# Patient Record
Sex: Male | Born: 1955
Health system: Southern US, Community
[De-identification: ages and names within clinical notes are randomized; demographics above are authoritative.]

## PROBLEM LIST (undated history)

## (undated) DIAGNOSIS — J383 Other diseases of vocal cords: Secondary | ICD-10-CM

## (undated) DIAGNOSIS — G2581 Restless legs syndrome: Secondary | ICD-10-CM

## (undated) DIAGNOSIS — IMO0001 Reserved for inherently not codable concepts without codable children: Secondary | ICD-10-CM

## (undated) DIAGNOSIS — I10 Essential (primary) hypertension: Secondary | ICD-10-CM

## (undated) DIAGNOSIS — M1990 Unspecified osteoarthritis, unspecified site: Secondary | ICD-10-CM

## (undated) DIAGNOSIS — E669 Obesity, unspecified: Secondary | ICD-10-CM

## (undated) DIAGNOSIS — G259 Extrapyramidal and movement disorder, unspecified: Secondary | ICD-10-CM

## (undated) DIAGNOSIS — G20 Parkinson's disease: Principal | ICD-10-CM

## (undated) DIAGNOSIS — G4730 Sleep apnea, unspecified: Secondary | ICD-10-CM

## (undated) DIAGNOSIS — K219 Gastro-esophageal reflux disease without esophagitis: Secondary | ICD-10-CM

## (undated) HISTORY — DX: Restless legs syndrome: G25.81

## (undated) HISTORY — PX: PAROTID GLAND TUMOR EXCISION: SHX5221

## (undated) HISTORY — DX: Reserved for inherently not codable concepts without codable children: IMO0001

## (undated) HISTORY — DX: Gastro-esophageal reflux disease without esophagitis: K21.9

## (undated) HISTORY — DX: Essential (primary) hypertension: I10

## (undated) HISTORY — DX: Other diseases of vocal cords: J38.3

## (undated) HISTORY — DX: Unspecified osteoarthritis, unspecified site: M19.90

## (undated) HISTORY — PX: CHOLECYSTECTOMY: SHX55

## (undated) HISTORY — DX: Obesity, unspecified: E66.9

## (undated) HISTORY — DX: Extrapyramidal and movement disorder, unspecified: G25.9

## (undated) HISTORY — DX: Parkinson's disease: G20

## (undated) HISTORY — PX: KNEE SURGERY: SHX244

## (undated) HISTORY — PX: OTHER SURGICAL HISTORY: SHX169

---

## 2010-09-15 ENCOUNTER — Ambulatory Visit (HOSPITAL_COMMUNITY)
Admission: RE | Admit: 2010-09-15 | Discharge: 2010-09-15 | Disposition: A | Discharge status: Home / Self Care | Payer: PRIVATE HEALTH INSURANCE | Source: Ambulatory Visit | Attending: Gastroenterology | Admitting: Gastroenterology

## 2010-09-15 DIAGNOSIS — R490 Dysphonia: Secondary | ICD-10-CM | POA: Insufficient documentation

## 2012-12-19 DIAGNOSIS — G245 Blepharospasm: Secondary | ICD-10-CM | POA: Insufficient documentation

## 2013-08-12 ENCOUNTER — Ambulatory Visit (INDEPENDENT_AMBULATORY_CARE_PROVIDER_SITE_OTHER): Payer: 59 | Admitting: Physician Assistant

## 2013-08-12 VITALS — BP 138/90 | HR 74 | Temp 97.8°F | Resp 16 | Ht 67.0 in | Wt 216.8 lb

## 2013-08-12 DIAGNOSIS — R059 Cough, unspecified: Secondary | ICD-10-CM

## 2013-08-12 DIAGNOSIS — R05 Cough: Secondary | ICD-10-CM

## 2013-08-12 DIAGNOSIS — J069 Acute upper respiratory infection, unspecified: Secondary | ICD-10-CM

## 2013-08-12 LAB — POCT CBC
Granulocyte percent: 62.3 %G (ref 37–80)
HCT, POC: 46.9 % (ref 43.5–53.7)
Hemoglobin: 15.3 g/dL (ref 14.1–18.1)
LYMPH, POC: 1.6 (ref 0.6–3.4)
MCH: 26.9 pg — AB (ref 27–31.2)
MCHC: 32.6 g/dL (ref 31.8–35.4)
MCV: 82.4 fL (ref 80–97)
MID (CBC): 0.3 (ref 0–0.9)
MPV: 10.4 fL (ref 0–99.8)
PLATELET COUNT, POC: 277 10*3/uL (ref 142–424)
POC Granulocyte: 3.3 (ref 2–6.9)
POC LYMPH PERCENT: 31.1 %L (ref 10–50)
POC MID %: 6.6 % (ref 0–12)
RBC: 5.69 M/uL (ref 4.69–6.13)
RDW, POC: 15.2 %
WBC: 5.3 10*3/uL (ref 4.6–10.2)

## 2013-08-12 LAB — POCT INFLUENZA A/B
Influenza A, POC: NEGATIVE
Influenza B, POC: NEGATIVE

## 2013-08-12 MED ORDER — HYDROCOD POLST-CHLORPHEN POLST 10-8 MG/5ML PO LQCR: 5.0000 mL | Freq: Two times a day (BID) | ORAL | Status: DC | PRN

## 2013-08-12 MED ORDER — BENZONATATE 200 MG PO CAPS: 200.0000 mg | ORAL_CAPSULE | Freq: Three times a day (TID) | ORAL | Status: DC | PRN

## 2013-08-12 NOTE — Progress Notes (Signed)
Subjective:    Patient ID: Stephen Best, male    DOB: 11/15/1955, 58 y.o.   MRN: [id number]  HPI Primary Physician:  Duane Lopeoss, Alan, MD  Chief Complaint: Cough x 1 week  HPI: 58 y.o. male with history below presents with a 1 week history of nasal congestion, post nasal drip, ST, cough, wheezing, headache, chills, and fatigue. Afebrile. Cough is not productive. Cough seems to get worse as the day progresses and with rapid movement, but not all the time. No SOB or chest pain. Sinus headache. 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 He has been taking Mucinex Dm and ibuprofen for the headaches. He did not get an influenza vaccine this year.    Past Medical History  Diagnosis Date  . HTN (hypertension)   . Reflux      Home Meds: Prior to Admission medications   Medication Sig Start Date End Date Taking? Authorizing Provider  lisinopril-hydrochlorothiazide (PRINZIDE,ZESTORETIC) 20-12.5 MG per tablet Take 1 tablet by mouth daily.   Yes Historical Provider, MD  omeprazole (PRILOSEC) 40 MG capsule Take 40 mg by mouth daily.   Yes Historical Provider, MD  pramipexole (MIRAPEX) 1.5 MG tablet Take 1.5 mg by mouth 3 (three) times daily.   Yes Historical Provider, MD  Vitamin D, Ergocalciferol, (DRISDOL) 50000 UNITS CAPS capsule Take 50,000 Units by mouth every 7 (seven) days.   Yes Historical Provider, MD    Allergies: No Known Allergies  History   Social History  . Marital Status: Divorced    Spouse Name: N/A    Number of Children: N/A  . Years of Education: N/A   Occupational History  . Not on file.   Social History Main Topics  . Smoking status: Never Smoker   . Smokeless tobacco: Not on file  . Alcohol Use: Yes  . Drug Use: No  . Sexual Activity: Not on file   Other Topics Concern  . Not on file   Social History Narrative  . No narrative on file     Review of Systems  Constitutional: Positive for chills and fatigue. Negative  for fever and appetite change.  HENT: Positive for congestion, postnasal drip, sinus pressure and sore throat.        Nasal congestion.   Respiratory: Positive for cough and wheezing. Negative for shortness of breath.        Cough is not productive. Cough is worse later on during the day and with rapid change of position.   Cardiovascular: Negative for chest pain.  Gastrointestinal: Negative for nausea, vomiting and diarrhea.  Musculoskeletal: Negative for myalgias.  Allergic/Immunologic: Positive for environmental allergies.  Neurological: Positive for headaches.       Sinus headaches.        Objective:   Physical Exam  Physical Exam: Blood pressure 138/90, pulse 74, temperature 97.8 F (36.6 C), temperature source Oral, resp. rate 16, height 5\' 7"  (1.702 m), weight 216 lb 12.8 oz (98.34 kg), SpO2 96.00%., Body mass index is 33.95 kg/(m^2). General: Well developed, well nourished, in no acute distress. Head: Normocephalic, atraumatic, eyes without discharge, sclera non-icteric, nares are congested. Bilateral auditory canals clear, TM's are without perforation, pearly grey and translucent with reflective cone of light bilaterally. No sinus TTP. Oral cavity moist, posterior pharynx without exudate, erythema, peritonsillar abscess, or post nasal drip. Uvula midline.  Neck: Supple. No thyromegaly. Full ROM. No lymphadenopathy. No nuchal rigidity.  Lungs: Clear bilaterally to auscultation without  wheezes, rales, or rhonchi. Breathing is unlabored. Heart: RRR with S1 S2. No murmurs, rubs, or gallops appreciated. Msk:  Strength and tone normal for age. Extremities/Skin: Warm and dry. No clubbing or cyanosis. No edema. No rashes or suspicious lesions. Neuro: Alert and oriented X 3. Moves all extremities spontaneously. Gait is normal. CNII-XII grossly in tact. Psych:  Responds to questions appropriately with a normal affect.   Labs: Results for orders placed in visit on 08/12/13  POCT CBC       Result Value Ref Range   WBC 5.3  4.6 - 10.2 K/uL   Lymph, poc 1.6  0.6 - 3.4   POC LYMPH PERCENT 31.1  10 - 50 %L   MID (cbc) 0.3  0 - 0.9   POC MID % 6.6  0 - 12 %M   POC Granulocyte 3.3  2 - 6.9   Granulocyte percent 62.3  37 - 80 %G   RBC 5.69  4.69 - 6.13 M/uL   Hemoglobin 15.3  14.1 - 18.1 g/dL   HCT, POC 16.1  09.6 - 53.7 %   MCV 82.4  80 - 97 fL   MCH, POC 26.9 (*) 27 - 31.2 pg   MCHC 32.6  31.8 - 35.4 g/dL   RDW, POC 04.5     Platelet Count, POC 277  142 - 424 K/uL   MPV 10.4  0 - 99.8 fL  POCT INFLUENZA A/B      Result Value Ref Range   Influenza A, POC Negative     Influenza B, POC Negative         Assessment & Plan:  58 year old male with URI and cough -Supportive care -Tessalon Perles 200 mg 1 po tid prn cough #60 no RF  -Tussionex 1 tsp po q 12 hours prn cough #90 mL -If cough persists into the next several days can call in Z pack -Advised patient to be mindful that his lisinopril can cause a cough. Should his cough persist he should either follow up with his PCP or [place] for further evaluation of this -Rest/fluids -RTC precautions   Eula Listen, MHS, PA-C [facility] [address] [place], [place] [address] [phone] [facility] 08/12/2013 3:44 PM

## 2013-10-02 ENCOUNTER — Ambulatory Visit (INDEPENDENT_AMBULATORY_CARE_PROVIDER_SITE_OTHER): Payer: 59 | Admitting: Family Medicine

## 2013-10-02 VITALS — BP 128/80 | HR 82 | Temp 98.1°F | Resp 16 | Ht 66.5 in | Wt 215.8 lb

## 2013-10-02 DIAGNOSIS — H109 Unspecified conjunctivitis: Secondary | ICD-10-CM

## 2013-10-02 DIAGNOSIS — J329 Chronic sinusitis, unspecified: Secondary | ICD-10-CM

## 2013-10-02 DIAGNOSIS — R0982 Postnasal drip: Secondary | ICD-10-CM

## 2013-10-02 DIAGNOSIS — R059 Cough, unspecified: Secondary | ICD-10-CM

## 2013-10-02 DIAGNOSIS — R05 Cough: Secondary | ICD-10-CM

## 2013-10-02 DIAGNOSIS — B9789 Other viral agents as the cause of diseases classified elsewhere: Secondary | ICD-10-CM

## 2013-10-02 DIAGNOSIS — J069 Acute upper respiratory infection, unspecified: Secondary | ICD-10-CM

## 2013-10-02 MED ORDER — BENZONATATE 100 MG PO CAPS: 100.0000 mg | ORAL_CAPSULE | Freq: Three times a day (TID) | ORAL | Status: DC | PRN

## 2013-10-02 MED ORDER — OFLOXACIN 0.3 % OP SOLN: 1.0000 [drp] | Freq: Four times a day (QID) | OPHTHALMIC | Status: DC

## 2013-10-02 MED ORDER — HYDROCODONE-HOMATROPINE 5-1.5 MG/5ML PO SYRP: 5.0000 mL | ORAL_SOLUTION | ORAL | Status: DC | PRN

## 2013-10-02 MED ORDER — AMOXICILLIN 875 MG PO TABS: 875.0000 mg | ORAL_TABLET | Freq: Two times a day (BID) | ORAL | Status: DC

## 2013-10-02 NOTE — Patient Instructions (Signed)
Drink plenty of fluids and try and get enough rest  Use the eyedrops primarily in the right eye, but occasional 1 in the left eye would probably be good.  Use the benzonatate pills in the daytime as needed for cough  Use the cough syrup at nighttime, hydrocodone  If you develop a lot of purulent drainage or coughing up a lot of green or yellow mucus go ahead and begin the amoxicillin  Return if worse

## 2013-10-02 NOTE — Progress Notes (Signed)
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 He fell again a fever but it is stiff several times and did not have one. By early in the weekend Friday and Saturday he developed a back cough. He then also developed a severe right conjunctivitis. Eyes red and draining. He has a little irritation on the left eye, but not like the right. He has had nasal pressure congestion postnasal drainage. He coughing but not coughing up anything. Does not smoke  : TMs normal Nose congested Right eye very injected. Fundi benign. Throat clear. Chest clear. Heart regular without murmurs. No nodes.  Assessment: Conjunctivitis Cough Sinusitis with postnasal drainage Viral syndrome  Plan: Cough medications and antibiotic eyedrops. Gave prescription for amoxicillin it is not doing better or starts bring a lot of purulent phlegm up he can go and take.

## 2014-02-28 ENCOUNTER — Encounter: Payer: Self-pay | Admitting: *Deleted

## 2014-02-28 ENCOUNTER — Telehealth: Payer: Self-pay | Admitting: *Deleted

## 2014-02-28 NOTE — Telephone Encounter (Signed)
See today's phone note. This patient is no longer a patient here. I did speak with him and he transferred care to [facility]. He just wanted [place] to have this info.

## 2014-02-28 NOTE — Telephone Encounter (Signed)
Noted  

## 2014-04-04 DIAGNOSIS — J383 Other diseases of vocal cords: Secondary | ICD-10-CM | POA: Insufficient documentation

## 2014-04-05 DIAGNOSIS — H25019 Cortical age-related cataract, unspecified eye: Secondary | ICD-10-CM | POA: Insufficient documentation

## 2014-05-02 ENCOUNTER — Ambulatory Visit (INDEPENDENT_AMBULATORY_CARE_PROVIDER_SITE_OTHER): Payer: 59 | Admitting: Neurology

## 2014-05-02 ENCOUNTER — Encounter: Payer: Self-pay | Admitting: Neurology

## 2014-05-02 VITALS — BP 130/83 | HR 90 | Ht 67.0 in | Wt 220.6 lb

## 2014-05-02 DIAGNOSIS — G20A1 Parkinson's disease without dyskinesia, without mention of fluctuations: Secondary | ICD-10-CM

## 2014-05-02 DIAGNOSIS — G2581 Restless legs syndrome: Secondary | ICD-10-CM

## 2014-05-02 DIAGNOSIS — G20 Parkinson's disease: Secondary | ICD-10-CM

## 2014-05-02 HISTORY — DX: Parkinson's disease without dyskinesia, without mention of fluctuations: G20.A1

## 2014-05-02 HISTORY — DX: Parkinson's disease: G20

## 2014-05-02 HISTORY — DX: Restless legs syndrome: G25.81

## 2014-05-02 NOTE — Patient Instructions (Signed)
Parkinson Disease Parkinson disease is a disorder of the central nervous system, which includes the brain and spinal cord. A person with this disease slowly loses the ability to completely control body movements. Within the brain, there is a group of nerve cells (basal ganglia) that help control movement. The basal ganglia are damaged and do not work properly in a person with Parkinson disease. In addition, the basal ganglia produce and use a brain chemical called dopamine. The dopamine chemical sends messages to other parts of the body to control and coordinate body movements. Dopamine levels are low in a person with Parkinson disease. If the dopamine levels are low, then the body does not receive the correct messages it needs to move normally.  CAUSES  The exact reason why the basal ganglia get damaged is not known. Some medical researchers have thought that infection, genes, environment, and certain medicines may contribute to the cause.  SYMPTOMS   An early symptom of Parkinson disease is often an uncontrolled shaking (tremor) of the hands. The tremor will often disappear when the affected hand is consciously used.  As the disease progresses, walking, talking, getting out of a chair, and new movements become more difficult.  Muscles get stiff and movements become slower.  Balance and coordination become harder.  Depression, trouble swallowing, urinary problems, constipation, and sleep problems can occur.  Later in the disease, memory and thought processes may deteriorate. DIAGNOSIS  There are no specific tests to diagnose Parkinson disease. You may be referred to a neurologist for evaluation. Your caregiver will ask about your medical history, symptoms, and perform a physical exam. Blood tests and imaging tests of your brain may be performed to rule out other diseases. The imaging tests may include an MRI or a CT scan. TREATMENT  The goal of treatment is to relieve symptoms. Medicines may be  prescribed once the symptoms become troublesome. Medicine will not stop the progression of the disease, but medicine can make movement and balance better and help control tremors. Speech and occupational therapy may also be prescribed. Sometimes, surgical treatment of the brain can be done in young people. HOME CARE INSTRUCTIONS  Get regular exercise and rest periods during the day to help prevent exhaustion and depression.  If getting dressed becomes difficult, replace buttons and zippers with Velcro and elastic on your clothing.  Take all medicine as directed by your caregiver.  Install grab bars or railings in your home to prevent falls.  Go to speech or occupational therapy as directed.  Keep all follow-up visits as directed by your caregiver. SEEK MEDICAL CARE IF:  Your symptoms are not controlled with your medicine.  You fall.  You have trouble swallowing or choke on your food. MAKE SURE YOU:  Understand these instructions.  Will watch your condition.  Will get help right away if you are not doing well or get worse. Document Released: 05/22/2000 Document Revised: 09/19/2012 Document Reviewed: 06/24/2011 ExitCare Patient Information 2015 [employer]. This information is not intended to replace advice given to you by your health care provider. Make sure you discuss any questions you have with your health care provider.  

## 2014-05-02 NOTE — Progress Notes (Signed)
Reason for visit: Parkinsonism  Stephen Best is a 58 y.o. male  History of present illness:  Stephen Best is a 58 year old right-handed white male with a history of restless leg syndrome. The patient has been seen through this office in the past, last seen on 01/20/2011. The patient had been on Mirapex for the restless leg syndrome, and he has been doing relatively well with this. The patient indicates that within the last several years he has developed blepharospasm, and he was treated with Botox injections. This seemed to help initially, but the last several treatments have not been quite as effective. Within the last 6 months, he began having some tremors involving the hands, right greater than left. He has had some difficulty manipulating a mouse on the computer, and he has noted some micrographia with handwriting. He believes that he is developing some cognitive issues over time. [facility], and it was felt that he had early Parkinson's disease. The patient was placed on Sinemet taking the 10/100 mg tablets 3 times daily. The patient believes that this has helped his handwriting some. The patient is tolerating the medication fairly well. He denies any gait instability or problems with falling. He continues to have problems with bladder spasm, and he indicates that he has difficulty opening his eyes once they go shut. He denies any family history of heart disease or tremor. He is sent to this office for further evaluation. He has not had a recent MRI of the brain.  Past Medical History  Diagnosis Date  . HTN (hypertension)   . Reflux   . Movement disorder   . Parkinson disease 05/02/2014  . Restless leg syndrome 05/02/2014  . Degenerative arthritis   . Obesity   . Vocal cord granuloma     Past Surgical History  Procedure Laterality Date  . Knee surgery Right     Arthroscopic  . Parotid gland tumor excision      adenoma  . Cholecystectomy       Family History  Problem Relation Age of Onset  . Pancreatic cancer Mother   . Heart failure Father   . Kidney disease Father   . Hypertension Brother   . Parkinsonism Neg Hx     Social history:  reports that he has never smoked. He has never used smokeless tobacco. He reports that he drinks alcohol. He reports that he does not use illicit drugs.  Medications:  Current Outpatient Prescriptions on File Prior to Visit  Medication Sig Dispense Refill  . lisinopril-hydrochlorothiazide (PRINZIDE,ZESTORETIC) 20-12.5 MG per tablet Take 1 tablet by mouth daily.    Marland Kitchen. omeprazole (PRILOSEC) 40 MG capsule Take 40 mg by mouth daily.    . pramipexole (MIRAPEX) 1.5 MG tablet Take 1.5 mg by mouth 3 (three) times daily.    . Vitamin D, Ergocalciferol, (DRISDOL) 50000 UNITS CAPS capsule Take 50,000 Units by mouth every 7 (seven) days.     No current facility-administered medications on file prior to visit.     No Known Allergies  ROS:  Out of a complete 14 system review of symptoms, the patient complains only of the following symptoms, and all other reviewed systems are negative.  Excessive thirst Restless legs, daytime drowsiness Environmental allergies Urinary urgency Joint pain Tremors  Blood pressure 130/83, pulse 90, height 5\' 7"  (1.702 m), weight 220 lb 9.6 oz (100.064 kg).  Physical Exam  General: The patient is alert and cooperative at the time of  the examination. The patient is moderately obese.  Eyes: Pupils are equal, round, and reactive to light. Discs are flat bilaterally.  Neck: The neck is supple, no carotid bruits are noted.  Respiratory: The respiratory examination is clear.  Cardiovascular: The cardiovascular examination reveals a regular rate and rhythm, no obvious murmurs or rubs are noted.  Skin: Extremities are without significant edema.  Neurologic Exam  Mental status: The patient is alert and oriented x 3 at the time of the examination. The patient  has apparent normal recent and remote memory, with an apparently normal attention span and concentration ability.  Cranial nerves: Facial symmetry is present. There is good sensation of the face to pinprick and soft touch bilaterally. The strength of the facial muscles and the muscles to head turning and shoulder shrug are normal bilaterally. Speech is well enunciated, no aphasia or dysarthria is noted. Extraocular movements are full. Visual fields are full. The tongue is midline, and the patient has symmetric elevation of the soft palate. No obvious hearing deficits are noted.  Motor: The motor testing reveals 5 over 5 strength of all 4 extremities. Good symmetric motor tone is noted throughout.  Sensory: Sensory testing is intact to pinprick, soft touch, vibration sensation, and position sense on all 4 extremities. No evidence of extinction is noted.  Coordination: Cerebellar testing reveals good finger-nose-finger and heel-to-shin bilaterally. No evidence of an action tremor or resting tremor is seen.  Gait and station: Gait is normal. The patient is able to rise from a seated position with arms crossed. Once up, the patient has good arm swing bilaterally with ambulation. Tandem gait is normal. Romberg is negative. No drift is seen.  Reflexes: Deep tendon reflexes are symmetric and normal bilaterally. Toes are downgoing bilaterally.    MRI brain 04/02/2010:  Impression: Normal MRI brain (without contrast).   Assessment/Plan:  1. Reported Parkinson's disease  2. Restless leg syndrome  3. Blepharospasm  The patient currently has no signs of parkinsonism. However, he is on medications to include Sinemet and Mirapex which may mask the features of early parkinsonism. The patient will be sent for MRI evaluation of the brain. He will be followed over time. No adjustments to his medications will be made at this time. The patient has undergone formal neuropsychological evaluation through  [facility]. The results of this evaluation is not available to me.  Marlan Palau. Keith Willis MD 05/02/2014 7:09 PM  [facility] [address] [place], [place] [id number]  Phone [phone] Fax [phone]

## 2014-06-20 ENCOUNTER — Ambulatory Visit
Admission: RE | Admit: 2014-06-20 | Discharge: 2014-06-20 | Disposition: A | Discharge status: Home / Self Care | Payer: 59 | Source: Ambulatory Visit | Attending: Neurology | Admitting: Neurology

## 2014-06-20 DIAGNOSIS — G20 Parkinson's disease: Secondary | ICD-10-CM

## 2014-06-20 DIAGNOSIS — G2581 Restless legs syndrome: Secondary | ICD-10-CM

## 2014-06-21 ENCOUNTER — Telehealth: Payer: Self-pay | Admitting: Neurology

## 2014-06-21 NOTE — Telephone Encounter (Signed)
  I called the patient. The MRI of the brain is normal.   MRI brain 06/21/2014:  IMPRESSION:  Normal MRI brain (without).

## 2014-10-03 ENCOUNTER — Ambulatory Visit: Payer: 59 | Admitting: Neurology

## 2014-11-28 ENCOUNTER — Ambulatory Visit (INDEPENDENT_AMBULATORY_CARE_PROVIDER_SITE_OTHER): Payer: 59 | Admitting: Nurse Practitioner

## 2014-11-28 ENCOUNTER — Encounter: Payer: Self-pay | Admitting: Nurse Practitioner

## 2014-11-28 VITALS — BP 127/84 | HR 83 | Ht 67.0 in | Wt 224.0 lb

## 2014-11-28 DIAGNOSIS — G2581 Restless legs syndrome: Secondary | ICD-10-CM

## 2014-11-28 DIAGNOSIS — G20 Parkinson's disease: Secondary | ICD-10-CM | POA: Diagnosis not present

## 2014-11-28 NOTE — Patient Instructions (Signed)
Continue Sinemet and Mirapex at current doses Follow-up in 6-8 months

## 2014-11-28 NOTE — Progress Notes (Signed)
I have read the note, and I agree with the clinical assessment and plan.  Stephen Best,Stephen Best   

## 2014-11-28 NOTE — Progress Notes (Signed)
[facility]  PATIENT: Stephen Best DOB: 06-Apr-1956   REASON FOR VISIT: Follow-up for parkinsonism, restless leg syndrome and blepharospasm HISTORY FROM: Patient    HISTORY OF PRESENT ILLNESS:[name] is a 59 year old right-handed white male with a history of restless leg syndrome. The patient was last seen in this office by Dr. Anne Hahn 05/02/2014 and previously 01/20/2011. The patient had been on Mirapex for the restless leg syndrome, and he has been doing relatively well with this. The patient indicates that within the last several years he has developed blepharospasm, and he was treated with Botox injections. This seemed to help initially, but the last several treatments have not been quite as effective. Within the last 6 months, he began having some tremors involving the hands, right greater than left. He has had some difficulty manipulating a mouse on the computer, and he has noted some micrographia with handwriting. He believes that he is developing some cognitive issues over time. He was seen through [facility] for medical evaluation, and it was felt that he had early Parkinson's disease. The patient was placed on Sinemet taking the 10/100 mg tablets 3 times daily. The patient believes that this has helped his handwriting some. The patient is tolerating the medication fairly well. He denies any gait instability or problems with falling. He continues to have problems with bladder spasm.Denies any family history of Parkinson's disease or tremor. He returns for reevaluation. MRI of the brain in January 2016 was normal.   REVIEW OF SYSTEMS: Full 14 system review of systems performed and notable only for those listed, all others are neg:  Constitutional: neg  Cardiovascular: neg Ear/Nose/Throat: neg  Skin: neg Eyes: neg Respiratory: neg Gastroitestinal: Urinary incontinence Hematology/Lymphatic: neg  Endocrine: neg Musculoskeletal:neg Allergy/Immunology:  neg Neurological: neg Psychiatric: neg Sleep : Restless leg   ALLERGIES: No Known Allergies  HOME MEDICATIONS: Outpatient Prescriptions Prior to Visit  Medication Sig Dispense Refill  . fesoterodine (TOVIAZ) 4 MG TB24 tablet Take 4 mg by mouth daily.    Marland Kitchen lisinopril-hydrochlorothiazide (PRINZIDE,ZESTORETIC) 20-12.5 MG per tablet Take 1 tablet by mouth daily.    Marland Kitchen omeprazole (PRILOSEC) 40 MG capsule Take 40 mg by mouth daily.    . pramipexole (MIRAPEX) 1.5 MG tablet Take 2.5 mg by mouth daily.     . carbidopa-levodopa (SINEMET IR) 10-100 MG per tablet Take 1 tablet by mouth 3 (three) times daily.    . Vitamin D, Ergocalciferol, (DRISDOL) 50000 UNITS CAPS capsule Take 50,000 Units by mouth every 7 (seven) days.     No facility-administered medications prior to visit.    PAST MEDICAL HISTORY: Past Medical History  Diagnosis Date  . HTN (hypertension)   . Reflux   . Movement disorder   . Parkinson disease 05/02/2014  . Restless leg syndrome 05/02/2014  . Degenerative arthritis   . Obesity   . Vocal cord granuloma     PAST SURGICAL HISTORY: Past Surgical History  Procedure Laterality Date  . Knee surgery Right     Arthroscopic  . Parotid gland tumor excision      adenoma  . Cholecystectomy      FAMILY HISTORY: Family History  Problem Relation Age of Onset  . Pancreatic cancer Mother   . Heart failure Father   . Kidney disease Father   . Hypertension Brother   . Parkinsonism Neg Hx     SOCIAL HISTORY: History   Social History  . Marital Status: Divorced    Spouse Name: N/A  . Number  of Children: N/A  . Years of Education: N/A   Occupational History  . Not on file.   Social History Main Topics  . Smoking status: Never Smoker   . Smokeless tobacco: Never Used  . Alcohol Use: Yes  . Drug Use: No  . Sexual Activity: Not on file   Other Topics Concern  . Not on file   Social History Narrative     PHYSICAL EXAM  Filed Vitals:   11/28/14 1351   BP: 127/84  Pulse: 83  Height:  (1.702 m)  Weight: 224 lb (101.606 kg)   Body mass index is 35.08 kg/(m^2). General: The patient is alert and cooperative at the time of the examination. The patient is moderately obese. Neck: The neck is supple, no carotid bruits are noted. Cardiovascular: The cardiovascular examination reveals a regular rate and rhythm, no obvious murmurs or rubs are noted. Skin: Extremities are without significant edema.  Neurologic Exam  Mental status: The patient is alert and oriented x 3 at the time of the examination. The patient has apparent normal recent and remote memory, with an apparently normal attention span and concentration ability.  Cranial nerves: Pupils are equal, round, and reactive to light. Discs are flat bilaterally. Extraocular movements are full. Visual fields are full.Facial symmetry is present. There is good sensation of the face to pinprick and soft touch bilaterally. The strength of the facial muscles and the muscles to head turning and shoulder shrug are normal bilaterally. Speech is well enunciated, no aphasia or dysarthria is noted.  The tongue is midline, and the patient has symmetric elevation of the soft palate. No obvious hearing deficits are noted. Motor: The motor testing reveals 5 over 5 strength of all 4 extremities. Good symmetric motor tone is noted throughout. Sensory: Sensory testing is intact to pinprick, soft touch, vibration sensation, and position sense on all 4 extremities. No evidence of extinction is noted. Coordination: Cerebellar testing reveals good finger-nose-finger and heel-to-shin bilaterally. No evidence of an action tremor or resting tremor is seen. Gait and station: Gait is normal. The patient is able to rise from a seated position with arms crossed. Once up, the patient has good arm swing bilaterally with ambulation. Tandem gait is normal. Romberg is negative. No drift is seen. Reflexes: Deep tendon reflexes are  symmetric and normal bilaterally. Toes are downgoing bilaterally.     DIAGNOSTIC DATA (LABS, IMAGING, TESTING) - ASSESSMENT AND PLAN  59 y.o. year old male  has a past medical history of HTN (hypertension); Reflux; Movement disorder; Parkinson disease (05/02/2014); Restless leg syndrome (05/02/2014); Degenerative arthritis; Obesity; and Vocal cord granuloma. here to follow-up 1. Parkinsonism 2. Restless leg syndrome 3. Blepharospasm  Continue Sinemet and Mirapex at current doses Follow-up in 6-8 months Nilda Riggs, [facility], [facility] APRN  [facility] [address], [address] [place], [place] [address] [phone]

## 2015-03-27 ENCOUNTER — Other Ambulatory Visit: Payer: Self-pay | Admitting: Neurology

## 2015-03-28 NOTE — Telephone Encounter (Signed)
OV notes say patient has been taking Sinemet 10/100 one three times daily, but patient is requesting 25/100mg  three times daily indicating this is his current dose.  It does not appear we have prescribed Sinemet previously.  Okay to send this dose?  Thank you.

## 2015-05-29 ENCOUNTER — Telehealth: Payer: Self-pay | Admitting: Neurology

## 2015-05-29 ENCOUNTER — Telehealth: Payer: Self-pay

## 2015-05-29 MED ORDER — PRAMIPEXOLE DIHYDROCHLORIDE 1.5 MG PO TABS: 1.5000 mg | ORAL_TABLET | Freq: Three times a day (TID) | ORAL | Status: DC

## 2015-05-29 NOTE — Telephone Encounter (Signed)
Noreene LarssonJill called from Dr. Ephriam Knucklesankin's office. She needed clarification on the patient's Mirapex. I advised that according to Dr. Anne HahnWillis' note, the patient should be taking Mirapex 1.5 mg three times daily.

## 2015-05-29 NOTE — Telephone Encounter (Signed)
I called the patient. He is on mirapex 1.5 mg tablets, for PD, he should be taking 1 tablet three times a day. He has been taking all three at night for RLS. He is also on sinemet. I will call in the Rx for him, indicating he should be spreading out the doses during the day.

## 2015-06-16 ENCOUNTER — Other Ambulatory Visit: Payer: Self-pay | Admitting: Neurology

## 2015-06-19 ENCOUNTER — Encounter: Payer: Self-pay | Admitting: Nurse Practitioner

## 2015-06-19 ENCOUNTER — Ambulatory Visit (INDEPENDENT_AMBULATORY_CARE_PROVIDER_SITE_OTHER): Payer: 59 | Admitting: Nurse Practitioner

## 2015-06-19 VITALS — BP 130/88 | HR 84 | Ht 67.0 in | Wt 232.0 lb

## 2015-06-19 DIAGNOSIS — G20 Parkinson's disease: Secondary | ICD-10-CM | POA: Diagnosis not present

## 2015-06-19 DIAGNOSIS — H04129 Dry eye syndrome of unspecified lacrimal gland: Secondary | ICD-10-CM | POA: Insufficient documentation

## 2015-06-19 DIAGNOSIS — G2581 Restless legs syndrome: Secondary | ICD-10-CM | POA: Diagnosis not present

## 2015-06-19 NOTE — Patient Instructions (Addendum)
Continue Mirapex at current dose, does not need refills Continue Carbodopa Levodopa at current dose F/U in 6 months

## 2015-06-19 NOTE — Progress Notes (Signed)
I have read the note, and I agree with the clinical assessment and plan.  Jacklyn Branan KEITH   

## 2015-06-19 NOTE — Progress Notes (Signed)
[facility]  PATIENT: Stephen Best DOB: July 26, 1955   REASON FOR VISIT: Follow-up for Parkinson's disease, restless leg syndrome, blepharospasm HISTORY FROM: Patient    HISTORY OF PRESENT ILLNESS: Stephen Best, 60 year old male returns for follow-up. He has a history of restless leg syndrome at this medication was previously prescribed by Dr. Luciana Axe. He was on Mirapex 1.5 to be taken 3 times a day however he was taking all of his medication at bedtime. This is now changed. In addition he has Parkinson's disease diagnosed at [facility] is currently on carbidopa levodopa 25/100 he takes 2 tablets in the morning and 1 late afternoon. He does this because he forgets to take the medication mid day. No tremors noted. No difficulty with ambulation and falls. He denies any cognitive issues. He continues to get Botox for his blepharospasm at [facility]. He has had some micrographia with his handwriting, he claims this usually occurs in the late afternoon. He returns for reevaluation   HISTORY: Stephen Best is a 60 year old right-handed white male with a history of restless leg syndrome. The patient was last seen in this office by Dr. Anne Hahn 05/02/2014 and previously 01/20/2011. The patient had been on Mirapex for the restless leg syndrome, and he has been doing relatively well with this. The patient indicates that within the last several years he has developed blepharospasm, and he was treated with Botox injections. This seemed to help initially, but the last several treatments have not been quite as effective. Within the last 6 months, he began having some tremors involving the hands, right greater than left. He has had some difficulty manipulating a mouse on the computer, and he has noted some micrographia with handwriting. He believes that he is developing some cognitive issues over time. He was seen through [facility] for medical evaluation, and it was felt that he had early Parkinson's  disease. The patient was placed on Sinemet taking the 10/100 mg tablets 3 times daily. The patient believes that this has helped his handwriting some. The patient is tolerating the medication fairly well. He denies any gait instability or problems with falling. He continues to have problems with bladder spasm.Denies any family history of Parkinson's disease or tremor. He returns for reevaluation. MRI of the brain in January 2016 was normal.   REVIEW OF SYSTEMS: Full 14 system review of systems performed and notable only for those listed, all others are neg:  Constitutional: Fatigue Cardiovascular: neg Ear/Nose/Throat: neg  Skin: neg Eyes: neg Respiratory: neg Gastroitestinal: neg  Hematology/Lymphatic: neg  Endocrine: neg Musculoskeletal:neg Allergy/Immunology: neg Neurological: Tremors Psychiatric: neg Sleep : Restless legs   ALLERGIES: No Known Allergies  HOME MEDICATIONS: Outpatient Prescriptions Prior to Visit  Medication Sig Dispense Refill  . carbidopa-levodopa (SINEMET IR) 25-100 MG tablet TAKE ONE TABLET BY MOUTH THREE TIMES DAILY 270 tablet 0  . cholecalciferol (VITAMIN D) 1000 UNITS tablet Take 2,000 Units by mouth daily.    . fesoterodine (TOVIAZ) 4 MG TB24 tablet Take 4 mg by mouth daily.    Marland Kitchen lisinopril-hydrochlorothiazide (PRINZIDE,ZESTORETIC) 20-12.5 MG per tablet Take 1 tablet by mouth daily.    Marland Kitchen omeprazole (PRILOSEC) 40 MG capsule Take 40 mg by mouth daily.    . pramipexole (MIRAPEX) 1.5 MG tablet Take 1 tablet (1.5 mg total) by mouth 3 (three) times daily. (Patient taking differently: Take 1.5 mg by mouth. Started today 1.5mg  in AM and 3.0mg  in PM) 90 tablet 5  . atovaquone-proguanil (MALARONE) 250-100 MG TABS Reported on 06/19/2015  0  .  ciprofloxacin (CIPRO) 500 MG tablet Take 500 mg by mouth 2 (two) times daily. Reported on 06/19/2015  0  . methylphenidate (RITALIN) 10 MG tablet Reported on 06/19/2015  0   No facility-administered medications prior to visit.     PAST MEDICAL HISTORY: Past Medical History  Diagnosis Date  . HTN (hypertension)   . Reflux   . Movement disorder   . Parkinson disease (HCC) 05/02/2014  . Restless leg syndrome 05/02/2014  . Degenerative arthritis   . Obesity   . Vocal cord granuloma     PAST SURGICAL HISTORY: Past Surgical History  Procedure Laterality Date  . Knee surgery Right     Arthroscopic  . Parotid gland tumor excision      adenoma  . Cholecystectomy      FAMILY HISTORY: Family History  Problem Relation Age of Onset  . Pancreatic cancer Mother   . Heart failure Father   . Kidney disease Father   . Hypertension Brother   . Parkinsonism Neg Hx     SOCIAL HISTORY: Social History   Social History  . Marital Status: Divorced    Spouse Name: N/A  . Number of Children: N/A  . Years of Education: N/A   Occupational History  . Not on file.   Social History Main Topics  . Smoking status: Never Smoker   . Smokeless tobacco: Never Used  . Alcohol Use: Yes  . Drug Use: No  . Sexual Activity: Not on file   Other Topics Concern  . Not on file   Social History Narrative     PHYSICAL EXAM  Filed Vitals:   06/19/15 1305  BP: 130/88  Pulse: 84  Height: 5\' 7"  (1.702 m)  Weight: 232 lb (105.235 kg)   Body mass index is 36.33 kg/(m^2). General: The patient is alert and cooperative at the time of the examination. The patient is moderately obese. Neck: The neck is supple, no carotid bruits are noted. Cardiovascular: The cardiovascular examination reveals a regular rate and rhythm, no obvious murmurs or rubs are noted. Skin: Extremities are without significant edema.  Neurologic Exam  Mental status: The patient is alert and oriented x 3 at the time of the examination. The patient has apparent normal recent and remote memory, with an apparently normal attention span and concentration ability.  Cranial nerves: Pupils are equal, round, and reactive to light.  Extraocular movements are  full. Visual fields are full.Negative Myerson sign Facial symmetry is present. There is good sensation of the face to pinprick and soft touch bilaterally. The strength of the facial muscles and the muscles to head turning and shoulder shrug are normal bilaterally. Speech is well enunciated, no aphasia or dysarthria is noted. The tongue is midline, and the patient has symmetric elevation of the soft palate. No obvious hearing deficits are noted. Motor: The motor testing reveals 5 over 5 strength of all 4 extremities. Good symmetric motor tone is noted throughout. No outstretched tremor or resting tremor is seen Sensory: Sensory testing is intact to pinprick, soft touch, vibration sensation, and position sense on all 4 extremities. No evidence of extinction is noted. Coordination: Cerebellar testing reveals good finger-nose-finger and heel-to-shin bilaterally. No evidence of an action tremor or resting tremor is seen. Gait and station: Gait is normal. The patient is able to rise from a seated position with arms crossed. Once up, the patient has good arm swing bilaterally with ambulation. Tandem gait is normal. Romberg is negative. No drift is seen. Reflexes: Deep  tendon reflexes are symmetric and normal bilaterally. Toes are downgoing bilaterally.  DIAGNOSTIC DATA (LABS, IMAGING, TESTING) -  ASSESSMENT AND PLAN  60 y.o. year old male with medical history of hypertension, movement disorder, Parkinson's disease, restless leg syndrome, degenerative arthritis and blepharospasm here to follow-up.  Continue Mirapex at current dose, does not need refills(patient is taking 2.5 pills daily)of the 1.5 mg dose Continue Carbodopa Levodopa 25/100mg  3 times daily Moderate exercise F/U in 6 months, next with Dr. Woody SellerWillis Nancy Carolyn Martin, [facility]NP, [facility], APRN  [facility] [address], [address] [place], [place] [address] [phone]

## 2015-09-25 ENCOUNTER — Other Ambulatory Visit: Payer: Self-pay | Admitting: Neurology

## 2015-12-09 ENCOUNTER — Other Ambulatory Visit: Payer: Self-pay | Admitting: Neurology

## 2015-12-25 ENCOUNTER — Ambulatory Visit: Payer: 59 | Admitting: Neurology

## 2016-01-09 ENCOUNTER — Encounter: Payer: Self-pay | Admitting: Neurology

## 2016-01-09 ENCOUNTER — Ambulatory Visit (INDEPENDENT_AMBULATORY_CARE_PROVIDER_SITE_OTHER): Payer: 59 | Admitting: Neurology

## 2016-01-09 VITALS — BP 118/84 | HR 88 | Resp 16 | Ht 67.0 in | Wt 230.0 lb

## 2016-01-09 DIAGNOSIS — G20 Parkinson's disease: Secondary | ICD-10-CM

## 2016-01-09 DIAGNOSIS — G2581 Restless legs syndrome: Secondary | ICD-10-CM | POA: Diagnosis not present

## 2016-01-09 MED ORDER — PRAMIPEXOLE DIHYDROCHLORIDE ER 4.5 MG PO TB24: 4.5000 mg | ORAL_TABLET | Freq: Every day | ORAL | 1 refills | Status: DC

## 2016-01-09 NOTE — Progress Notes (Signed)
Reason for visit: Parkinson's disease  Stephen Best is an 60 y.o. male  History of present illness:  Stephen Best is a 59 year old right-handed white male with a history of mild Parkinson's disease, and a history of restless leg syndrome since 1977. The patient has been on low-dose Sinemet, he takes Mirapex 1.5 mg 3 times daily, he had been taking it all at night. Since switching to 3 times daily, and he is having periods of time during the day where he is extremely drowsy, particularly around lunchtime. The patient has difficulty staying awake. He indicates that he does snore at night, but he generally goes to bed late, he does not act out his dreams, he does not have vivid dreams. He has not having problems with balance. Occasionally, he may have a tremor that affects his ability to use the mouse on a computer. The patient may have some decreased amplitude of the voice in the evening hours. He returns for an evaluation.  Past Medical History:  Diagnosis Date  . Degenerative arthritis   . HTN (hypertension)   . Movement disorder   . Obesity   . Parkinson disease (HCC) 05/02/2014  . Reflux   . Restless leg syndrome 05/02/2014  . Vocal cord granuloma     Past Surgical History:  Procedure Laterality Date  . CHOLECYSTECTOMY    . KNEE SURGERY Right    Arthroscopic  . PAROTID GLAND TUMOR EXCISION     adenoma    Family History  Problem Relation Age of Onset  . Pancreatic cancer Mother   . Heart failure Father   . Kidney disease Father   . Hypertension Brother   . Parkinsonism Neg Hx     Social history:  reports that he has never smoked. He has never used smokeless tobacco. He reports that he drinks alcohol. He reports that he does not use drugs.   No Known Allergies  Medications:  Prior to Admission medications   Medication Sig Start Date End Date Taking? Authorizing Provider  carbidopa-levodopa (SINEMET IR) 25-100 MG tablet TAKE ONE TABLET BY MOUTH THREE TIMES DAILY  09/25/15  Yes Stephen Spaniel, MD  cholecalciferol (VITAMIN D) 1000 UNITS tablet Take 2,000 Units by mouth daily.   Yes Historical Provider, MD  fesoterodine (TOVIAZ) 4 MG TB24 tablet Take 4 mg by mouth daily.   Yes Historical Provider, MD  lisinopril-hydrochlorothiazide (PRINZIDE,ZESTORETIC) 20-12.5 MG per tablet Take 1 tablet by mouth daily.   Yes Historical Provider, MD  omeprazole (PRILOSEC) 40 MG capsule Take 40 mg by mouth daily.   Yes Historical Provider, MD  Pramipexole Dihydrochloride (MIRAPEX ER) 4.5 MG TB24 Take 1 tablet (4.5 mg total) by mouth daily after supper. 01/09/16   Stephen Spaniel, MD    ROS:  Out of a complete 14 system review of symptoms, the patient complains only of the following symptoms, and all other reviewed systems are negative.  Fatigue Restless legs, insomnia, daytime sleepiness Environmental allergies Incontinence of the bladder Joint pain Speech difficulty, tremors  Blood pressure 118/84, pulse 88, resp. rate 16, height 5\' 7"  (1.702 m), weight 230 lb (104.3 kg).  Physical Exam  General: The patient is alert and cooperative at the time of the examination. The patient is moderately obese.  Skin: No significant peripheral edema is noted.   Neurologic Exam  Mental status: The patient is alert and oriented x 3 at the time of the examination. The patient has apparent normal recent and remote memory, with an apparently  normal attention span and concentration ability.   Cranial nerves: Facial symmetry is present. Speech is normal, no aphasia or dysarthria is noted. Extraocular movements are full. Visual fields are full.  Motor: The patient has good strength in all 4 extremities.  Sensory examination: Soft touch sensation is symmetric on the face, arms, and legs.  Coordination: The patient has good finger-nose-finger and heel-to-shin bilaterally. No evidence of a resting tremor seen.  Gait and station: The patient has a normal gait. Tandem gait is  normal. Romberg is negative. No drift is seen. The patient has arm swing with walking.  Reflexes: Deep tendon reflexes are symmetric.   Assessment/Plan:  1. Parkinson's disease  2. Restless leg syndrome  The patient has no real features of Parkinson's disease on clinical examination today. The patient will continue the low-dose Sinemet, we will switch the Mirapex to an extended release preparation taking 4.5 mg in the evening. The excessive daytime drowsiness may be related to the Mirapex dosing. In the future, we may consider a sleep evaluation if the drowsiness continues. He will follow-up otherwise in 6 months. If the extended release Mirapex is helpful, he is to contact our office for a 90 day supply.  Marlan Palau MD 01/09/2016 8:14 AM  [facility] [address] [place], [place] [address]  Phone [phone] Fax [phone]

## 2016-01-09 NOTE — Patient Instructions (Addendum)
Restless Legs Syndrome Restless legs syndrome is a condition that causes uncomfortable feelings or sensations in the legs, especially while sitting or lying down. The sensations usually cause an overwhelming urge to move the legs. The arms can also sometimes be affected. The condition can range from mild to severe. The symptoms often interfere with a person's ability to sleep. CAUSES The cause of this condition is not known. RISK FACTORS This condition is more likely to develop in:  People who are older than age 50.  Pregnant women. In general, restless legs syndrome is more common in women than in men.  People who have a family history of the condition.  People who have certain medical conditions, such as iron deficiency, kidney disease, Parkinson disease, or nerve damage.  People who take certain medicines, such as medicines for high blood pressure, nausea, colds, allergies, depression, and some heart conditions. SYMPTOMS The main symptom of this condition is uncomfortable sensations in the legs. These sensations may be:  Described as pulling, tingling, prickling, throbbing, crawling, or burning.  Worse while you are sitting or lying down.  Worse during periods of rest or inactivity.  Worse at night, often interfering with your sleep.  Accompanied by a very strong urge to move your legs.  Temporarily relieved by movement of your legs. The sensations usually affect both sides of the body. The arms can also be affected, but this is rare. People who have this condition often have tiredness during the day because of their lack of sleep at night. DIAGNOSIS This condition may be diagnosed based on your description of the symptoms. You may also have tests, including blood tests, to check for other conditions that may lead to your symptoms. In some cases, you may be asked to spend some time in a sleep lab so your sleeping can be monitored. TREATMENT Treatment for this condition is  focused on managing the symptoms. Treatment may include:  Self-help and lifestyle changes.  Medicines. HOME CARE INSTRUCTIONS  Take medicines only as directed by your health care provider.  Try these methods to get temporary relief from the uncomfortable sensations:  Massage your legs.  Walk or stretch.  Take a cold or hot bath.  Practice good sleep habits. For example, go to bed and get up at the same time every day.  Exercise regularly.  Practice ways of relaxing, such as yoga or meditation.  Avoid caffeine and alcohol.  Do not use any tobacco products, including cigarettes, chewing tobacco, or electronic cigarettes. If you need help quitting, ask your health care provider.  Keep all follow-up visits as directed by your health care provider. This is important. SEEK MEDICAL CARE IF: Your symptoms do not improve with treatment, or they get worse.   This information is not intended to replace advice given to you by your health care provider. Make sure you discuss any questions you have with your health care provider.   Document Released: 05/15/2002 Document Revised: 10/09/2014 Document Reviewed: 05/21/2014 Elsevier Interactive Patient Education 2016 [employer]  

## 2016-01-13 ENCOUNTER — Other Ambulatory Visit: Payer: Self-pay

## 2016-01-13 ENCOUNTER — Other Ambulatory Visit: Payer: Self-pay | Admitting: Neurology

## 2016-01-13 ENCOUNTER — Telehealth: Payer: Self-pay | Admitting: Neurology

## 2016-01-13 MED ORDER — PRAMIPEXOLE DIHYDROCHLORIDE 1.5 MG PO TABS: 1.5000 mg | ORAL_TABLET | Freq: Three times a day (TID) | ORAL | 0 refills | Status: DC

## 2016-01-13 NOTE — Telephone Encounter (Signed)
[facility] [address] [phone] called to request refill of pramipexole (MIRAPEX) 1.5 MG tablet

## 2016-01-13 NOTE — Telephone Encounter (Signed)
Rx recently e-scribed.

## 2016-01-14 NOTE — Telephone Encounter (Signed)
PA Case for Pramipexole ER 4.5 mg tabs [id number] is Approved. For further questions, call [phone]. Pharmacy notified of approval.

## 2016-01-14 NOTE — Addendum Note (Signed)
Addended by: Donnelly AngelicaHOGAN, JENNIFER L on: 01/14/2016 12:46 PM   Modules accepted: Orders

## 2016-02-04 ENCOUNTER — Other Ambulatory Visit: Payer: Self-pay | Admitting: Neurology

## 2016-03-16 ENCOUNTER — Telehealth: Payer: Self-pay | Admitting: Neurology

## 2016-03-16 ENCOUNTER — Other Ambulatory Visit: Payer: Self-pay | Admitting: Neurology

## 2016-03-16 DIAGNOSIS — G479 Sleep disorder, unspecified: Secondary | ICD-10-CM

## 2016-03-16 NOTE — Telephone Encounter (Signed)
Pt called in about his mid day sleepiness and states it is not due to his meds he is still having mid-day sleepiness. Please call and advise [phone]

## 2016-03-16 NOTE — Telephone Encounter (Signed)
I called patient, left a message, I will call back later. 

## 2016-03-16 NOTE — Telephone Encounter (Signed)
I called 2 more times and left messages. I will set up a sleep evaluation for the drowsiness.

## 2016-04-08 ENCOUNTER — Ambulatory Visit (INDEPENDENT_AMBULATORY_CARE_PROVIDER_SITE_OTHER): Payer: 59 | Admitting: Neurology

## 2016-04-08 ENCOUNTER — Encounter: Payer: Self-pay | Admitting: Neurology

## 2016-04-08 VITALS — BP 134/70 | HR 88 | Resp 16 | Ht 67.0 in | Wt 228.0 lb

## 2016-04-08 DIAGNOSIS — E669 Obesity, unspecified: Secondary | ICD-10-CM

## 2016-04-08 DIAGNOSIS — G20 Parkinson's disease: Secondary | ICD-10-CM | POA: Diagnosis not present

## 2016-04-08 DIAGNOSIS — R400 Somnolence: Secondary | ICD-10-CM | POA: Diagnosis not present

## 2016-04-08 DIAGNOSIS — R0683 Snoring: Secondary | ICD-10-CM

## 2016-04-08 DIAGNOSIS — G2581 Restless legs syndrome: Secondary | ICD-10-CM | POA: Diagnosis not present

## 2016-04-08 DIAGNOSIS — G4761 Periodic limb movement disorder: Secondary | ICD-10-CM

## 2016-04-08 NOTE — Patient Instructions (Signed)

## 2016-04-08 NOTE — Progress Notes (Signed)
Subjective:    Patient ID: Stephen Best is a 60 y.o. male.  HPI    Huston Foley, MD, PhD [facility] [address] [address] [place], [place] [address]  Dear [name],    I saw your patient, Stephen Best, upon your kind request in my clinic today for initial consultation of his sleep disorder, in particular evaluation for his daytime somnolence. The patient is unaccompanied today. As you know, [name] is a 60 year old right-handed gentleman with an underlying medical history of parkinsonism, restless leg syndrome, hypertension, reflux disease, and obesity, who reports snoring and excessive daytime somnolence. I reviewed your office note from 01/09/2016. He was recently advised to switch from immediate release pramipexole to long acting. Before he was diagnosed with RLS he had he had issues with sleep onset insomnia. He works as a International aid/development worker, Teacher, English as a foreign language. He is divorced, has one son, one daughter, lives alone, has a GF.  Non smoker, drinks alcohol 2-3 times per week. Coffee in AM, no sodas.  His Epworth sleepiness score is 15 out of 24 today, his fatigue score is 38 out of 63. He has a tendency to move his legs and twitches in his sleep. He denies any dream enactments. He has been told he snores. He denies morning headaches or nocturia. He denies a family history of Parkinson's disease or obstructive sleep apnea.  His Past Medical History Is Significant For: Past Medical History:  Diagnosis Date  . Degenerative arthritis   . HTN (hypertension)   . Movement disorder   . Obesity   . Parkinson disease (HCC) 05/02/2014  . Reflux   . Restless leg syndrome 05/02/2014  . Vocal cord granuloma     Her Past Surgical History Is Significant For: Past Surgical History:  Procedure Laterality Date  . CHOLECYSTECTOMY    . KNEE SURGERY Right    Arthroscopic  . PAROTID GLAND TUMOR EXCISION     adenoma    His Family History Is Significant For: Family History  Problem  Relation Age of Onset  . Pancreatic cancer Mother   . Heart failure Father   . Kidney disease Father   . Dementia Father   . Diabetes Father   . Hypertension Brother   . Parkinsonism Neg Hx     His Social History Is Significant For: Social History   Social History  . Marital status: Divorced    Spouse name: N/A  . Number of children: 2  . Years of education: Post Grad   Social History Main Topics  . Smoking status: Never Smoker  . Smokeless tobacco: Never Used  . Alcohol use Yes  . Drug use: No  . Sexual activity: Not Asked   Other Topics Concern  . None   Social History Narrative   Drinks 2 caffeine drinks in the morning     His Allergies Are:  No Known Allergies:   His Current Medications Are:  Outpatient Encounter Prescriptions as of 04/08/2016  Medication Sig  . carbidopa-levodopa (SINEMET IR) 25-100 MG tablet TAKE ONE TABLET BY MOUTH THREE TIMES DAILY  . cholecalciferol (VITAMIN D) 1000 UNITS tablet Take 2,000 Units by mouth daily.  . fesoterodine (TOVIAZ) 4 MG TB24 tablet Take 4 mg by mouth daily.  Marland Kitchen lisinopril-hydrochlorothiazide (PRINZIDE,ZESTORETIC) 20-12.5 MG per tablet Take 1 tablet by mouth daily.  Marland Kitchen omeprazole (PRILOSEC) 40 MG capsule Take 40 mg by mouth daily.  . Pramipexole Dihydrochloride 4.5 MG TB24 TAKE 1 TABLET (4.5 MG TOTAL) BY MOUTH DAILY AFTER  SUPPER.   No facility-administered encounter medications on file as of 04/08/2016.   :  Review of Systems:  Out of a complete 14 point review of systems, all are reviewed and negative with the exception of these symptoms as listed below: Review of Systems  Neurological:       Patient feels that he sleep light at times, restless legs, snores, morning headaches, daytime fatigue, denies taking naps.    Epworth Sleepiness Scale 0= would never doze 1= slight chance of dozing 2= moderate chance of dozing 3= high chance of dozing  Sitting and reading:3 Watching TV:2 Sitting inactive in a public place  (ex. Theater or meeting):3 As a passenger in a car for an hour without a break:2 Lying down to rest in the afternoon:1 Sitting and talking to someone:0 Sitting quietly after lunch (no alcohol):2 In a car, while stopped in traffic:2 Total:15  Objective:  Neurologic Exam  Physical Exam Physical Examination:   Vitals:   04/08/16 1607  BP: 134/70  Pulse: 88  Resp: 16    General Examination: The patient is a very pleasant 60 y.o. male in no acute distress. He appears well-developed and well-nourished and adequately groomed.   HEENT: Normocephalic, atraumatic, pupils are equal, round and reactive to light and accommodation. Extraocular tracking is good without limitation to gaze excursion or nystagmus noted. Fairly normal smooth pursuit is noted. Hearing is grossly intact. Face is symmetric with mild facial masking noted and decrease in eye blink rate. He has mild twitching around the right eye. Speech is clear with  very mild hypophonia noted. There is no lip, neck/head, jaw or voice tremor. Neck is very mildly rigid with full range of passive and active motion. There are no carotid bruits on auscultation. Oropharynx exam reveals: moderate mouth dryness, adequate dental hygiene and moderate airway crowding, due to smaller airway entry and tonsils in place, uvula is wider and larger. Mallampati is class II. Tongue protrudes centrally and palate elevates symmetrically. Tonsils are not fully visulized. Neck size is 16 1/8 inches. He has a Mild overbite. Nasal inspection reveals no significant nasal mucosal bogginess or redness and no septal deviation.   Chest: Clear to auscultation without wheezing, rhonchi or crackles noted.  Heart: S1+S2+0, regular and normal without murmurs, rubs or gallops noted.   Abdomen: Soft, non-tender and non-distended with normal bowel sounds appreciated on auscultation.  Extremities: There is 2+ pitting edema in the distal lower extremities bilaterally. Pedal  pulses are intact.  Skin: Warm and dry without trophic changes noted. There are no varicose veins.  Musculoskeletal: exam reveals no obvious joint deformities, tenderness or joint swelling or erythema.   Neurologically:  Mental status: The patient is awake, alert and oriented in all 4 spheres. His immediate and remote memory, attention, language skills and fund of knowledge are appropriate. There is no evidence of aphasia, agnosia, apraxia or anomia. Thought process is linear. Mood is normal and affect is normal.  Cranial nerves II - XII are as described above under HEENT exam. In addition: shoulder shrug is normal with equal shoulder height noted. Motor exam: Normal bulk, and strength are noted. He has mild increase in tone in the right upper extremity. There is no drift, resting tremor or rebound. Romberg is negative. Reflexes are 1+ throughout. Fine motor skills and coordination:  he has mild impairment with finger taps, hand movements and rapid alternating patting as well as foot agility and foot taps on the right, minimally so on the left.  Cerebellar  testing: No dysmetria or intention tremor on finger to nose testing. Heel to shin is unremarkable bilaterally. There is no truncal or gait ataxia.  Sensory exam: intact to light touch in the upper and lower extremities.  Gait, station and balance: He stands easily.  mildly stooped posture for age. He walks with good pace and stride length, decreased arm swing on the right. He turns fairly quickly, no significant balance impairment noted.   Assessment and Plan:   In summary, Foye Spurlingaul Mays is a very pleasant 60 y.o.-year old male with an underlying medical history of parkinsonism, restless leg syndrome, hypertension, reflux disease, and obesity, whose history and physical exam are concerning for obstructive sleep apnea (OSA). In addition, he endorses a long-standing history of restless leg syndrome since his late teenage years her early 7920s and PLMS.  He is on long-acting pramipexole at this time. I do worry about his lower extremity swelling which could be in part related to the dopamine agonist. Furthermore, he is advised that dopamine agonists can cause daytime somnolence. I had a long chat with the patient about my findings and the diagnosis of OSA, its prognosis and treatment options. We talked about medical treatments, surgical interventions and non-pharmacological approaches. I explained in particular the risks and ramifications of untreated moderate to severe OSA, especially with respect to developing cardiovascular disease down the Road, including congestive heart failure, difficult to treat hypertension, cardiac arrhythmias, or stroke. Even type 2 diabetes has, in part, been linked to untreated OSA. Symptoms of untreated OSA include daytime sleepiness, memory problems, mood irritability and mood disorder such as depression and anxiety, lack of energy, as well as recurrent headaches, especially morning headaches. We talked about trying to maintain a healthy lifestyle in general, as well as the importance of weight control. I encouraged the patient to eat healthy, exercise daily and keep well hydrated, to keep a scheduled bedtime and wake time routine, to not skip any meals and eat healthy snacks in between meals. I advised the patient not to drive when feeling sleepy. I recommended the following at this time: sleep study with potential positive airway pressure titration. (We will score hypopneas at 4% and split the sleep study into diagnostic and treatment portion, if the estimated. 2 hour AHI is >20/h).   I explained the sleep test procedure to the patient and also outlined possible surgical and non-surgical treatment options of OSA, including the use of a custom-made dental device (which would require a referral to a specialist dentist or oral surgeon), upper airway surgical options, such as pillar implants, radiofrequency surgery, tongue base  surgery, and UPPP (which would involve a referral to an ENT surgeon). Rarely, jaw surgery such as mandibular advancement may be considered.  I also explained the CPAP treatment option to the patient, who indicated that he would be willing to try CPAP if the need arises. I explained the importance of being compliant with PAP treatment, not only for insurance purposes but primarily to improve His symptoms, and for the patient's long term health benefit, including to reduce His cardiovascular risks. I answered all his questions today and the patient was in agreement. I would like to see him back after the sleep study is completed and encouraged him to call with any interim questions, concerns, problems or updates.   Thank you very much for allowing me to participate in the care of this nice patient. If I can be of any further assistance to you please do not hesitate to talk to  me.   Sincerely,   Huston Foley, MD, PhD

## 2016-05-29 ENCOUNTER — Ambulatory Visit (INDEPENDENT_AMBULATORY_CARE_PROVIDER_SITE_OTHER): Payer: 59 | Admitting: Neurology

## 2016-05-29 DIAGNOSIS — G4733 Obstructive sleep apnea (adult) (pediatric): Secondary | ICD-10-CM | POA: Diagnosis not present

## 2016-05-29 DIAGNOSIS — G479 Sleep disorder, unspecified: Secondary | ICD-10-CM

## 2016-05-29 DIAGNOSIS — G4720 Circadian rhythm sleep disorder, unspecified type: Secondary | ICD-10-CM

## 2016-05-29 DIAGNOSIS — G4761 Periodic limb movement disorder: Secondary | ICD-10-CM

## 2016-06-04 ENCOUNTER — Telehealth: Payer: Self-pay

## 2016-06-04 NOTE — Addendum Note (Signed)
Addended by: Huston FoleyATHAR, Tremaine Earwood on: 06/04/2016 08:35 AM   Modules accepted: Orders

## 2016-06-04 NOTE — Procedures (Signed)
PATIENT'S NAME:  Stephen Best, Stephen Best DOB:      [birth year]      MR#:    [id number]     DATE OF RECORDING: 05/29/2016 REFERRING M.D.:  Beverley FiedlerVictoria Rankins MD Study Performed:  Split-Night Titration Study HISTORY:  60 year old man with a history of parkinsonism, restless leg syndrome, hypertension, reflux disease, and obesity, who reports snoring and excessive daytime somnolence. The patient endorsed the Epworth Sleepiness Scale at 15 points. The patient's weight 228 pounds with a height of 67 (inches), resulting in a BMI of 35.6 kg/m2. The patient's neck circumference measured 16.2 inches.  CURRENT MEDICATIONS: Sinemet, Vitamin D, Toviaz, Prinzide, Prilosec, Pramipexole Dihydrochloride    PROCEDURE:  This is a multichannel digital polysomnogram utilizing the Somnostar 11.2 system.  Electrodes and sensors were applied and monitored per AASM Specifications.   EEG, EOG, Chin and Limb EMG, were sampled at 200 Hz.  ECG, Snore and Nasal Pressure, Thermal Airflow, Respiratory Effort, CPAP Flow and Pressure, Oximetry was sampled at 50 Hz. Digital video and audio were recorded.      BASELINE STUDY WITHOUT CPAP RESULTS:  Lights Out was at 22:00 and Lights On at 05:05 for the night.  Total recording time (TRT) was 171, with a total sleep time (TST) of 140.5 minutes.   The patient's sleep latency was 11 minutes.  REM latency was 96.5 minutes, which is normal.  The sleep efficiency was 82.2 %.    SLEEP ARCHITECTURE: WASO (Wake after sleep onset) was 24 minutes with moderate sleep fragmentation noted, Stage N1 was 5.5 minutes, Stage N2 was 125.5 minutes, Stage N3 was 0.5 minutes and Stage R (REM sleep) was 9 minutes.  The percentages were Stage N1 3.9%, Stage N2 89.3%, which is markedly increased, Stage N3 .4% and Stage R (REM sleep) 6.4%.   The arousals were noted as: 37 were spontaneous, 52 were associated with PLMs, 66 were associated with respiratory events.   Audio and video analysis did not show any abnormal or  unusual movements, behaviors, phonations or vocalizations.  The patient took no bathroom breaks. Mild to moderate and at times loud snoring was noted.  EKG was in keeping with normal sinus rhythm (NSR).   RESPIRATORY ANALYSIS:  There were a total of 78 respiratory events:  52 obstructive apneas, 0 central apneas and 2 mixed apneas with a total of 54 apneas and an apnea index (AI) of 23.1. There were 24 hypopneas with a hypopnea index of 10.2. The patient also had 0 respiratory event related arousals (RERAs).  Snoring was noted.     The total APNEA/HYPOPNEA INDEX (AHI) was 33.3 /hour and the total RESPIRATORY DISTURBANCE INDEX was 33.3 /hour.  13 events occurred in REM sleep and 50 events in NREM. The REM AHI was 86.7, /hour versus a non-REM AHI of 29.7 /hour. The patient spent 68.5 minutes sleep time in the supine position 291 minutes in non-supine. The supine AHI was 0.0 /hour versus a non-supine AHI of 33.3 /hour.  OXYGEN SATURATION & C02:  The wake baseline 02 saturation was 93%, with the lowest being 81%. Time spent below 89% saturation equaled 11 minutes.  PERIODIC LIMB MOVEMENTS:    The patient had a total of 249 Periodic Limb Movements.  The Periodic Limb Movement (PLM) index was 106.3/hour and the PLM Arousal index was 22.2/hour.   TITRATION STUDY WITH CPAP RESULTS:   CPAP was initiated at 00:51 AM via medium Eson nasal mask with heated humidity, starting at 5 cmH20 per AASM split night standards,  pressure was advanced to 7 cmH20 because of hypopneas, apneas and desaturations.  At a PAP pressure of 7 cmH20, there was a reduction of the AHI to 0/hour, O2 nadir of 94% during non-supine and non-REM sleep. Brief supine REM was achieved on a pressure of 6 cm.   Total recording time (TRT) was 254.5 minutes, with a total sleep time (TST) of 219 minutes. The patient's sleep latency was 3.5 minutes. REM latency was 2 minutes.  The sleep efficiency was 86.1 %.    SLEEP ARCHITECTURE: Wake after sleep  was 31.5 minutes, Stage N1 7.5 minutes, Stage N2 167.5 minutes, Stage N3 22 minutes and Stage R (REM sleep) 22 minutes. The percentages were: Stage N1 3.4%, Stage N2 76.5%, Stage N3 10.% and Stage R (REM sleep) 10.%.   The arousals were noted as: 59 were spontaneous, 16 were associated with PLMs, 5 were associated with respiratory events.  RESPIRATORY ANALYSIS:  There were a total of 6 respiratory events: 3 obstructive apneas, 0 central apneas and 0 mixed apneas with a total of 3 apneas and an apnea index (AI) of .8. There were 3 hypopneas with a hypopnea index of .8 /hour. The patient also had 1 respiratory event related arousals (RERAs).      The total APNEA/HYPOPNEA INDEX  (AHI) was 1.6 /hour and the total RESPIRATORY DISTURBANCE INDEX was 1.9 /hour.  2 events occurred in REM sleep and 4 events in NREM. The REM AHI was 5.5 /hour versus a non-REM AHI of 1.2 /hour. REM sleep was achieved on a pressure of  cm/h2o (AHI was  .) The patient spent 31% of total sleep time in the supine position. The supine AHI was 2.7 /hour, versus a non-supine AHI of 1.2/hour.  OXYGEN SATURATION & C02:  The wake baseline 02 saturation was 95%, with the lowest being 89%. Time spent below 89% saturation equaled 0 minutes.  PERIODIC LIMB MOVEMENTS:    The patient had a total of 233 Periodic Limb Movements. The Periodic Limb Movement (PLM) index was 63.8 /hour and the PLM Arousal index was 4.4 /hour.   Post-study, the patient indicated that sleep was worse than usual.  POLYSOMNOGRAPHY IMPRESSION :  1. Obstructive Sleep Apnea (OSA)  2. Dysfunctions associated with sleep stages or arousals from sleep 3. Periodic Limb Movement Disorder (PLMD) 4. Repetitive Intrusions of Sleep  RECOMMENDATIONS:  1. This patient has severe obstructive sleep apnea and responded well on CPAP therapy. I will, therefore, start the patient on home CPAP treatment at a pressure of 7 cm via medium nasal mask with heated humidity. The patient  should be reminded to be fully compliant with PAP therapy to improve sleep related symptoms and decrease long term cardiovascular risks. Please note that untreated obstructive sleep apnea carries additional perioperative morbidity. Patients with significant obstructive sleep apnea should receive perioperative PAP therapy and the surgeons and particularly the anesthesiologist should be informed of the diagnosis and the severity of the sleep disordered breathing. 2. This study shows sleep fragmentation and abnormal sleep stage percentages; these are nonspecific findings and per se do not signify an intrinsic sleep disorder or a cause for the patient's sleep-related symptoms. Causes include (but are not limited to) the first night effect of the sleep study, circadian rhythm disturbances, medication effect or an underlying mood disorder or medical problem.  3. Severe PLMs (periodic limb movements of sleep) were noted during the study with mild to moderate arousals; clinical correlation is recommended. RLS medication may have to be adjusted.  4. The patient will be [facility] for discussion of the test results and further management strategies. The referring provider will be notified of the test results.   I certify that I have reviewed the entire raw data recording prior to the issuance of this report in accordance with the Standards of Accreditation of the American Academy of Sleep Medicine (AASM)    Huston FoleySaima Antonio Creswell, MD, PhD Diplomat, American Board of Psychiatry and Neurology (Neurology and Sleep Medicine)

## 2016-06-04 NOTE — Progress Notes (Signed)
Diana:  Patient referred by Dr. Anne HahnWillis, seen by me on 04/08/16, split study on 05/29/16. Please call and notify patient that the recent sleep study confirmed the diagnosis of severe OSA. He did well with CPAP during the study with significant improvement of the respiratory events. Therefore, I would like start the patient on CPAP therapy at home by prescribing a machine for home use. I placed the order in the chart. The patient will need a follow up appointment with me in 8 to 10 weeks post set up that has to be scheduled; please go ahead and schedule while you have the patient on the phone and make sure patient understands the importance of keeping this window for the FU appointment, as it is often an insurance requirement and failing to adhere to this may result in losing coverage for sleep apnea treatment.  Please re-enforce the importance of compliance with treatment and the need for us to monitor compliance data - again an insurance requirement and good feedback for the patient as far as how they are doing.  Also remind patient, that any upcoming CPAP machine or mask issues, should be first addressed with the DME company. Please ask if patient has a preference regarding DME company.  Please arrange for CPAP set up at home through a DME company of patient's choice - once you have spoken to the patient - and faxed/routed report to PCP and referring MD (if other than PCP), you can close this encounter, thanks,   Huston FoleySaima Sukhman Kocher, MD, PhD [facility]

## 2016-06-04 NOTE — Telephone Encounter (Signed)
I called pt to discuss sleep study results. No answer, left a message asking him to call me back.    

## 2016-06-04 NOTE — Telephone Encounter (Signed)
I called pt again, no answer, left a message on home and mobile number asking him to call me back. If pt calls back, please advise him that I am out of the office until 06/10/16 and will return his call that day.

## 2016-06-04 NOTE — Telephone Encounter (Signed)
-----   Message from Huston FoleySaima Athar, MD sent at 06/04/2016  8:35 AM EST ----- Stephen Best:  Patient referred by Dr. Anne HahnWillis, seen by me on 04/08/16, split study on 05/29/16. Please call and notify patient that the recent sleep study confirmed the diagnosis of severe OSA. He did well with CPAP during the study with significant improvement of the respiratory events. Therefore, I would like start the patient on CPAP therapy at home by prescribing a machine for home use. I placed the order in the chart. The patient will need a follow up appointment with me in 8 to 10 weeks post set up that has to be scheduled; please go ahead and schedule while you have the patient on the phone and make sure patient understands the importance of keeping this window for the FU appointment, as it is often an insurance requirement and failing to adhere to this may result in losing coverage for sleep apnea treatment.  Please re-enforce the importance of compliance with treatment and the need for us to monitor compliance data - again an insurance requirement and good feedback for the patient as far as how they are doing.  Also remind patient, that any upcoming CPAP machine or mask issues, should be first addressed with the DME company. Please ask if patient has a preference regarding DME company.  Please arrange for CPAP set up at home through a DME company of patient's choice - once you have spoken to the patient - and faxed/routed report to PCP and referring MD (if other than PCP), you can close this encounter, thanks,   Huston FoleySaima Athar, MD, PhD [facility]

## 2016-06-10 NOTE — Telephone Encounter (Signed)
[facility]

## 2016-06-11 NOTE — Telephone Encounter (Signed)
Patient has FU with Dr. Anne HahnWillis next month. Please encourage patient to make FU appt to discuss sleep study. He declined OSA treatment with CPAP and declined FU appointment with me at this time, indicated, he will call back to schedule appt. Please encourage patient to consider treatment for OSA when he returns to the appointment next month.

## 2016-06-11 NOTE — Telephone Encounter (Signed)
I spoke to pt and advised him that his sleep study results confirmed the diagnosis of severe osa and that he did well with the cpap with significant improvement of respiratory events. Dr. Frances FurbishAthar recommends that pt start cpap at home. Pt says that the night of his sleep study, it was the "worst night's sleep in the past 2 months" and that he did not sleep with with the cpap. Pt says that he had a cold that night and believes that this contributed to the apnea that was seen. He does not want to start a cpap. I offered him a follow up appt with Dr. Frances FurbishAthar to discuss further, but he declined. Pt says that he wants to lose weight and now that he has retired, he thinks he will sleep better. Pt says that he will call back if he decides to make a follow up appt. Pt verbalized understanding of results. Pt had no questions at this time but was encouraged to call back if questions arise.

## 2016-06-30 ENCOUNTER — Other Ambulatory Visit: Payer: Self-pay | Admitting: Neurology

## 2016-07-15 ENCOUNTER — Encounter: Payer: Self-pay | Admitting: Neurology

## 2016-07-15 ENCOUNTER — Ambulatory Visit (INDEPENDENT_AMBULATORY_CARE_PROVIDER_SITE_OTHER): Payer: 59 | Admitting: Neurology

## 2016-07-15 VITALS — BP 124/82 | HR 92 | Ht 67.0 in | Wt 238.5 lb

## 2016-07-15 DIAGNOSIS — G20 Parkinson's disease: Secondary | ICD-10-CM | POA: Diagnosis not present

## 2016-07-15 NOTE — Progress Notes (Signed)
Reason for visit: Parkinsonism  Stephen Best is an 61 y.o. male  History of present illness:  Stephen Best is a 75 year old right-handed white male with a history of parkinsonism. The patient has very mild features, he currently is on Mirapex taking 4.5 mg extended-release tablet at night. The patient has had a sleep issue, he underwent a sleep study but he is not on CPAP. He has recently retired, he is trying to exercise and lose weight. The patient has not had any issues with balance, and no falls, he reports no difficulty with swallowing or choking. He returns to this office for further evaluation.  Past Medical History:  Diagnosis Date  . Degenerative arthritis   . HTN (hypertension)   . Movement disorder   . Obesity   . Parkinson disease (HCC) 05/02/2014  . Reflux   . Restless leg syndrome 05/02/2014  . Vocal cord granuloma     Past Surgical History:  Procedure Laterality Date  . CHOLECYSTECTOMY    . KNEE SURGERY Right    Arthroscopic  . PAROTID GLAND TUMOR EXCISION     adenoma    Family History  Problem Relation Age of Onset  . Pancreatic cancer Mother   . Heart failure Father   . Kidney disease Father   . Dementia Father   . Diabetes Father   . Hypertension Brother   . Parkinsonism Neg Hx     Social history:  reports that he has never smoked. He has never used smokeless tobacco. He reports that he drinks alcohol. He reports that he does not use drugs.   No Known Allergies  Medications:  Prior to Admission medications   Medication Sig Start Date End Date Taking? Authorizing Provider  carbidopa-levodopa (SINEMET IR) 25-100 MG tablet TAKE ONE TABLET BY MOUTH THREE TIMES DAILY 06/30/16  Yes York Spaniel, MD  cholecalciferol (VITAMIN D) 1000 UNITS tablet Take 2,000 Units by mouth daily.   Yes Historical Provider, MD  fesoterodine (TOVIAZ) 4 MG TB24 tablet Take 4 mg by mouth daily.   Yes Historical Provider, MD  lisinopril-hydrochlorothiazide  (PRINZIDE,ZESTORETIC) 20-12.5 MG per tablet Take 1 tablet by mouth daily.   Yes Historical Provider, MD  omeprazole (PRILOSEC) 40 MG capsule Take 40 mg by mouth daily.   Yes Historical Provider, MD  Pramipexole Dihydrochloride 4.5 MG TB24 TAKE 1 TABLET (4.5 MG TOTAL) BY MOUTH DAILY AFTER SUPPER. 03/16/16  Yes York Spaniel, MD    ROS:  Out of a complete 14 system review of symptoms, the patient complains only of the following symptoms, and all other reviewed systems are negative.  Cough Restless legs, snoring Urinary urgency Low back pain  Blood pressure 124/82, pulse 92, height 5\' 7"  (1.702 m), weight 238 lb 8 oz (108.2 kg).  Physical Exam  General: The patient is alert and cooperative at the time of the examination. The patient is moderately to markedly obese.   Skin: No significant peripheral edema is noted.   Neurologic Exam  Mental status: The patient is alert and oriented x 3 at the time of the examination. The patient has apparent normal recent and remote memory, with an apparently normal attention span and concentration ability.   Cranial nerves: Facial symmetry is present. Speech is normal, no aphasia or dysarthria is noted. Extraocular movements are full. Visual fields are full. There is minimal masking of the face is seen.  Motor: The patient has good strength in all 4 extremities.  Sensory examination: Soft touch sensation is  symmetric on the face, arms, and legs.  Coordination: The patient has good finger-nose-finger and heel-to-shin bilaterally.  Gait and station: The patient has a normal gait. The patient has good stride, good turns, he is able to arise from a seated position with the arms crossed. With walking, there is decreased arm swing on the right. Tandem gait is normal. Romberg is negative. No drift is seen.  Reflexes: Deep tendon reflexes are symmetric.   Assessment/Plan:  1. Parkinsonism  The patient has minimal symptoms of Parkinson's disease,  but he is doing well at this time with mobility. We will continue the current medications, he will follow-up in 6 months.  Stephen Palau. Keith Lesta Limbert MD 07/15/2016 4:01 PM  [facility] [address] [place], [place] [id number]  Phone [phone] Fax [phone]

## 2016-07-25 ENCOUNTER — Emergency Department (HOSPITAL_COMMUNITY): Payer: 59

## 2016-07-25 ENCOUNTER — Emergency Department (HOSPITAL_COMMUNITY)
Admission: EM | Admit: 2016-07-25 | Discharge: 2016-07-25 | Disposition: A | Discharge status: Home / Self Care | Payer: 59 | Attending: Emergency Medicine | Admitting: Emergency Medicine

## 2016-07-25 ENCOUNTER — Encounter (HOSPITAL_COMMUNITY): Payer: Self-pay | Admitting: Emergency Medicine

## 2016-07-25 DIAGNOSIS — J40 Bronchitis, not specified as acute or chronic: Secondary | ICD-10-CM | POA: Diagnosis not present

## 2016-07-25 DIAGNOSIS — R05 Cough: Secondary | ICD-10-CM | POA: Diagnosis present

## 2016-07-25 DIAGNOSIS — Z79899 Other long term (current) drug therapy: Secondary | ICD-10-CM | POA: Diagnosis not present

## 2016-07-25 DIAGNOSIS — G20 Parkinson's disease: Secondary | ICD-10-CM | POA: Insufficient documentation

## 2016-07-25 DIAGNOSIS — I10 Essential (primary) hypertension: Secondary | ICD-10-CM | POA: Diagnosis not present

## 2016-07-25 MED ORDER — PREDNISONE 10 MG (21) PO TBPK: 10.0000 mg | ORAL_TABLET | Freq: Every day | ORAL | 0 refills | Status: DC

## 2016-07-25 MED ORDER — ALBUTEROL SULFATE HFA 108 (90 BASE) MCG/ACT IN AERS
2.0000 | INHALATION_SPRAY | Freq: Once | RESPIRATORY_TRACT | Status: AC
  Administered 2016-07-25: 2 via RESPIRATORY_TRACT
  Filled 2016-07-25: qty 6.7

## 2016-07-25 MED ORDER — AEROCHAMBER PLUS FLO-VU MEDIUM MISC
1.0000 | Freq: Once | Status: AC
  Administered 2016-07-25: 1
  Filled 2016-07-25: qty 1

## 2016-07-25 NOTE — Discharge Instructions (Signed)
Continue all your current treatments. Take inhaler 2 puffs every 4 hrs. Prednisone as prescribed until all gone. Follow up with pulmonology. Return if worsening symptoms.

## 2016-07-25 NOTE — ED Provider Notes (Signed)
[facility]-EMERGENCY DEPT Provider Note   CSN: [id number] Arrival date & time: 07/25/16  4401     History   Chief Complaint Chief Complaint  Patient presents with  . Cough    HPI Stephen Best is a 61 y.o. male.  HPI Stephen Best is a 61 y.o. male with hx of parkinson's disease, HTN, resrless leg syndrome, allergies, presents to ED with complaint of cough and congestion. Pt states he has had on and off cough for 16wks, states since thanks giving. He has been seen at [facility] several times, states has taken anti histamines, robitussin, saline nasal netti pot rinses. States symptoms are wosening. Pt is not a smoker. No hx of lung problems. Stats this has happened once before "years ago." Reports cough is non productive but "wet sounding." Reports expiratory wheezing. Denies fever or chills. Denies chest pain. No SOB. States cough is so severe he sometimes feel like he is going to "pass out."   Past Medical History:  Diagnosis Date  . Degenerative arthritis   . HTN (hypertension)   . Movement disorder   . Obesity   . Parkinson disease (HCC) 05/02/2014  . Reflux   . Restless leg syndrome 05/02/2014  . Vocal cord granuloma     Patient Active Problem List   Diagnosis Date Noted  . Dry eye 06/19/2015  . Parkinson disease (HCC) 05/02/2014  . Restless leg syndrome 05/02/2014  . Cataract cortical, senile 04/05/2014  . 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 Blepharospasm 12/19/2012    Past Surgical History:  Procedure Laterality Date  . CHOLECYSTECTOMY    . KNEE SURGERY Right    Arthroscopic  . PAROTID GLAND TUMOR EXCISION     adenoma       Home Medications    Prior to Admission medications   Medication Sig Start Date End Date Taking? Authorizing Provider  carbidopa-levodopa (SINEMET IR) 25-100 MG tablet TAKE ONE TABLET BY MOUTH THREE TIMES DAILY 06/30/16   York Spaniel, MD  cholecalciferol (VITAMIN D) 1000 UNITS tablet Take 2,000 Units by mouth daily.    Historical Provider,  MD  fesoterodine (TOVIAZ) 4 MG TB24 tablet Take 4 mg by mouth daily.    Historical Provider, MD  lisinopril-hydrochlorothiazide (PRINZIDE,ZESTORETIC) 20-12.5 MG per tablet Take 1 tablet by mouth daily.    Historical Provider, MD  omeprazole (PRILOSEC) 40 MG capsule Take 40 mg by mouth daily.    Historical Provider, MD  Pramipexole Dihydrochloride 4.5 MG TB24 TAKE 1 TABLET (4.5 MG TOTAL) BY MOUTH DAILY AFTER SUPPER. 03/16/16   York Spaniel, MD    Family History Family History  Problem Relation Age of Onset  . Pancreatic cancer Mother   . Heart failure Father   . Kidney disease Father   . Dementia Father   . Diabetes Father   . Hypertension Brother   . Parkinsonism Neg Hx     Social History Social History  Substance Use Topics  . Smoking status: Never Smoker  . Smokeless tobacco: Never Used  . Alcohol use Yes     Allergies   Patient has no known allergies.   Review of Systems Review of Systems  Constitutional: Negative for chills and fever.  HENT: Positive for congestion and sinus pain.   Respiratory: Positive for cough and shortness of breath. Negative for chest tightness.   Cardiovascular: Negative for chest pain, palpitations and leg swelling.  Gastrointestinal: Negative for abdominal distention, abdominal pain, diarrhea, nausea and vomiting.  Genitourinary: Negative for dysuria, frequency, hematuria  and urgency.  Musculoskeletal: Negative for arthralgias, myalgias, neck pain and neck stiffness.  Skin: Negative for rash.  Allergic/Immunologic: Negative for immunocompromised state.  Neurological: Negative for dizziness, weakness, light-headedness, numbness and headaches.  All other systems reviewed and are negative.    Physical Exam Updated Vital Signs BP 147/83 (BP Location: Left Arm)   Pulse 94   Temp 98.4 F (36.9 C) (Oral)   Resp 18   SpO2 95%   Physical Exam  Constitutional: He appears well-developed and well-nourished. No distress.  HENT:  Head:  Normocephalic and atraumatic.  Eyes: Conjunctivae are normal.  Neck: Neck supple.  Cardiovascular: Normal rate, regular rhythm and normal heart sounds.   Pulmonary/Chest: Effort normal. No respiratory distress. He has wheezes. He has no rales.  Expiratory wheezes  Abdominal: Soft. Bowel sounds are normal. He exhibits no distension. There is no tenderness. There is no rebound.  Musculoskeletal: He exhibits no edema.  Neurological: He is alert.  Skin: Skin is warm and dry.  Nursing note and vitals reviewed.    ED Treatments / Results  Labs (all labs ordered are listed, but only abnormal results are displayed) Labs Reviewed - No data to display  EKG  EKG Interpretation None       Radiology Dg Chest 2 View  Result Date: 07/25/2016 CLINICAL DATA:  Cough and wheezing EXAM: CHEST  2 VIEW COMPARISON:  None. FINDINGS: There is a questionable nipple shadow on the left. There is no edema or consolidation. Heart size and pulmonary vascularity are normal. No adenopathy. No bone lesions. IMPRESSION: Questionable nipple shadow on the left. Advise repeat study with nipple markers to confirm. No edema or consolidation. Electronically Signed   By: Bretta Bang III M.D.   On: 07/25/2016 09:02   Ct Chest Wo Contrast  Result Date: 07/25/2016 CLINICAL DATA:  Persistent cough, cough for 16 weeks, expiratory wheezing, history hypertension, Parkinson's EXAM: CT CHEST WITHOUT CONTRAST TECHNIQUE: Multidetector CT imaging of the chest was performed following the standard protocol without IV contrast. Sagittal and coronal MPR images reconstructed from axial data set. COMPARISON:  Chest radiograph 07/25/2016 FINDINGS: Cardiovascular: Atherosclerotic calcifications aorta and coronary arteries. Aorta normal caliber. No significant pericardial fluid. Mediastinum/Nodes: Tiny hiatal hernia. Esophagus otherwise unremarkable. Few scattered normal size mesenteric lymph nodes without thoracic adenopathy. Base of  cervical region normal appearance. Lungs/Pleura: Minimal atelectasis versus scarring at the lingula. 4 mm nodular density at minor fissure image 54. Minimal scarring in BILATERAL anterior upper lobes medially. No acute infiltrate, pleural effusion, or pneumothorax. No definite pulmonary mass or additional nodule. Upper Abdomen: Gallbladder surgically absent. Visualized upper abdomen otherwise unremarkable. Musculoskeletal: Few scattered Schmorl's nodes at vertebral endplates in thoracic spine. No acute osseous findings. IMPRESSION: Tiny hiatal hernia. Aortic atherosclerosis and coronary arterial calcification. Minimal scarring atelectasis in both lungs as above without infiltrate. 4 mm nonspecific nodule at minor fissure, recommendation below. No follow-up needed if patient is low-risk. Non-contrast chest CT can be considered in 12 months if patient is high-risk. This recommendation follows the consensus statement: Guidelines for Management of Incidental Pulmonary Nodules Detected on CT Images: From the Fleischner Society 2017; Radiology 2017; 284:228-243. Electronically Signed   By: Ulyses Southward M.D.   On: 07/25/2016 10:32    Procedures Procedures (including critical care time)  Medications Ordered in ED Medications  albuterol (PROVENTIL HFA;VENTOLIN HFA) 108 (90 Base) MCG/ACT inhaler 2 puff (not administered)     Initial Impression / Assessment and Plan / ED Course  I have reviewed the triage  vital signs and the nursing notes.  Pertinent labs & imaging results that were available during my care of the patient were reviewed by me and considered in my medical decision making (see chart for details).     Patient in emergency department with persistent cough for over 4 months. He does have mild expiratory wheezes on exam. His vital signs are normal. He does not appear to be in any respiratory distress. Afebrile. Patient is just frustrated about his persistent cough, unable to get in with his primary  care doctor. I will order him an inhaler. Chest x-ray showing a nodule in left lung, possibly a nipple shadow. I discussed this with Dr. Jeraldine LootsLockwood, will get CT of the chest for further evaluation.   10:59 AM CT is as described above. I have discussed all the findings with patient. He is aware. He will follow-up with family doctor regarding his arthrosclerosis and hiatal hernia. He will follow-up with pulmonary as well regarding his persistent cough. We'll start him on a prednisone taper. Inhaler. Continue all his allergy medications. Return precautions discussed. Patient stable for discharge.  Vitals:   07/25/16 0850  BP: 147/83  Pulse: 94  Resp: 18  Temp: 98.4 F (36.9 C)  TempSrc: Oral  SpO2: 95%     Final Clinical Impressions(s) / ED Diagnoses   Final diagnoses:  Bronchitis    New Prescriptions New Prescriptions   PREDNISONE (STERAPRED UNI-PAK 21 TAB) 10 MG (21) TBPK TABLET    Take 1 tablet (10 mg total) by mouth daily. Take 6 tabs by mouth daily  for 2 days, then 5 tabs for 2 days, then 4 tabs for 2 days, then 3 tabs for 2 days, 2 tabs for 2 days, then 1 tab by mouth daily for 2 days     Jaynie Crumbleatyana Cloma Rahrig, PA-C 07/25/16 1100    Gerhard Munchobert Lockwood, MD 07/26/16 [phone]

## 2016-07-25 NOTE — ED Triage Notes (Signed)
Per pt, states he has had a cough for 16 weeks-states he has been worked up by a couple of UCs-states expiratory wheezing-no relief with OTC meds

## 2016-08-20 ENCOUNTER — Ambulatory Visit (INDEPENDENT_AMBULATORY_CARE_PROVIDER_SITE_OTHER): Payer: 59 | Admitting: Pulmonary Disease

## 2016-08-20 ENCOUNTER — Encounter: Payer: Self-pay | Admitting: Pulmonary Disease

## 2016-08-20 VITALS — BP 126/88 | HR 100 | Ht 66.0 in | Wt 238.0 lb

## 2016-08-20 DIAGNOSIS — R053 Chronic cough: Secondary | ICD-10-CM

## 2016-08-20 DIAGNOSIS — R05 Cough: Secondary | ICD-10-CM | POA: Diagnosis not present

## 2016-08-20 LAB — NITRIC OXIDE: NITRIC OXIDE: 14

## 2016-08-20 MED ORDER — CHLORPHENIRAMINE MALEATE 4 MG PO TABS: 8.0000 mg | ORAL_TABLET | Freq: Three times a day (TID) | ORAL | 3 refills | Status: DC

## 2016-08-20 MED ORDER — HYDROCHLOROTHIAZIDE 12.5 MG PO CAPS: 12.5000 mg | ORAL_CAPSULE | Freq: Every day | ORAL | 4 refills | Status: DC

## 2016-08-20 MED ORDER — ALBUTEROL SULFATE HFA 108 (90 BASE) MCG/ACT IN AERS: 2.0000 | INHALATION_SPRAY | Freq: Four times a day (QID) | RESPIRATORY_TRACT | 0 refills | Status: DC | PRN

## 2016-08-20 NOTE — Patient Instructions (Addendum)
We will start you on chlorpheniramine 8 mg 3 times daily Continue using the Astelin nasal spray Continue using the Prilosec 40 mg We will give samples of albuterol rescue inhaler Stop using the lisinopril. We did review prescription for hydrochlorothiazide 12.5 mg daily. If her blood pressure starts increasing  let us or primary care know for adjustment of your blood pressure medications.  Return to clinic in 1 month.

## 2016-08-20 NOTE — Progress Notes (Signed)
Drayven Marchena    [id number]    1955-11-21  Primary Care Physician:Victoria R Rankins, MD  Referring Physician: Clayborn Heron, MD [address] [place], [place] [address]  Chief complaint:  Consult for evaluation of cough  HPI: 61 year old with past medical history of hypertension, hyperlipidemia, Parkinson's, GERD, vocal cord polyp. He has complains of paroxysmal cough since Thanksgiving this is nonproductive in nature not associated with dyspnea. He has occasional wheezing. There is no sputum production, fevers, chills. He has been treated with Robitussin, over-the-counter antihistamine, Astelin, astelin and Hycodan without any improvement in symptoms. He used albuterol inhaler with some improvement in symptoms.   He has history of acid reflux with esophagitis. He's had esophageal manometry done in the past at [facility] gastroenterology. 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 This has been resected and his last ENT examination shows no evidence of granuloma. He has occasional seasonal allergy, postnasal drip. He is a nonsmoker. 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  He has symptoms of OSA and a sleep study in December 2017 which showed severe sleep apnea with a CPAP was recommended but the patient feels that study is abnormal due to issues with cough, nasal congestion has declined CPAP therapy.  Outpatient Encounter Prescriptions as of 08/20/2016  Medication Sig  . carbidopa-levodopa (SINEMET IR) 25-100 MG tablet TAKE ONE TABLET BY MOUTH THREE TIMES DAILY  . cholecalciferol (VITAMIN D) 1000 UNITS tablet Take 2,000 Units by mouth daily.  . fesoterodine (TOVIAZ) 4 MG TB24 tablet Take 4 mg by mouth daily.  Marland Kitchen lisinopril-hydrochlorothiazide (PRINZIDE,ZESTORETIC) 20-12.5 MG per tablet Take 1 tablet by mouth daily.  Marland Kitchen omeprazole (PRILOSEC) 40 MG capsule Take 40 mg by mouth daily.  . Pramipexole Dihydrochloride 4.5 MG TB24  TAKE 1 TABLET (4.5 MG TOTAL) BY MOUTH DAILY AFTER SUPPER.  . [DISCONTINUED] predniSONE (STERAPRED UNI-PAK 21 TAB) 10 MG (21) TBPK tablet Take 1 tablet (10 mg total) by mouth daily. Take 6 tabs by mouth daily  for 2 days, then 5 tabs for 2 days, then 4 tabs for 2 days, then 3 tabs for 2 days, 2 tabs for 2 days, then 1 tab by mouth daily for 2 days   No facility-administered encounter medications on file as of 08/20/2016.     Allergies as of 08/20/2016  . (No Known Allergies)    Past Medical History:  Diagnosis Date  . Degenerative arthritis   . HTN (hypertension)   . Movement disorder   . Obesity   . Parkinson disease (HCC) 05/02/2014  . Reflux   . Restless leg syndrome 05/02/2014  . Vocal cord granuloma     Past Surgical History:  Procedure Laterality Date  . CHOLECYSTECTOMY    . KNEE SURGERY Right    Arthroscopic  . PAROTID GLAND TUMOR EXCISION     adenoma    Family History  Problem Relation Age of Onset  . Pancreatic cancer Mother   . Heart failure Father   . Kidney disease Father   . Dementia Father   . Diabetes Father   . Hypertension Brother   . Parkinsonism Neg Hx     Social History   Social History  . Marital status: Divorced    Spouse name: N/A  . Number of children: 2  . Years of education: Post Grad   Occupational History  . Semi-retired per patient    Social History Main Topics  .  Smoking status: Never Smoker  . Smokeless tobacco: Never Used  . Alcohol use Yes  . Drug use: No  . Sexual activity: Not on file   Other Topics Concern  . Not on file   Social History Narrative   Drinks 2 caffeine drinks in the morning     Review of systems: Review of Systems  Constitutional: Negative for fever and chills.  HENT: Negative.   Eyes: Negative for blurred vision.  Respiratory: as per HPI  Cardiovascular: Negative for chest pain and palpitations.  Gastrointestinal: Negative for vomiting, diarrhea, blood per rectum. Genitourinary: Negative for  dysuria, urgency, frequency and hematuria.  Musculoskeletal: Negative for myalgias, back pain and joint pain.  Skin: Negative for itching and rash.  Neurological: Negative for dizziness, tremors, focal weakness, seizures and loss of consciousness.  Endo/Heme/Allergies: Negative for environmental allergies.  Psychiatric/Behavioral: Negative for depression, suicidal ideas and hallucinations.  All other systems reviewed and are negative.  Physical Exam: Blood pressure 126/88, pulse 100, height 5\' 6"  (1.676 m), weight 238 lb (108 kg), SpO2 96 %. Gen:      No acute distress HEENT:  EOMI, sclera anicteric Neck:     No masses; no thyromegaly Lungs:    Clear to auscultation bilaterally; normal respiratory effort CV:         Regular rate and rhythm; no murmurs Abd:      + bowel sounds; soft, non-tender; no palpable masses, no distension Ext:    No edema; adequate peripheral perfusion Skin:      Warm and dry; no rash Neuro: alert and oriented x 3 Psych: normal mood and affect  Data Reviewed: Sleep study 05/29/16 Severe sleep apnea with total AHI 33.3, RDI 33.3. Lowest O2 sats 81% CPAP titrated to 7 cm Severe PLM's were noted.  Chest x-ray 2/and in/18-clear lungs. Question of left nipple shadow. CT chest 07/25/16-minimal scarring, atelectasis at the bases. 4 mm nodule in the minor fissure I have reviewed all images personally.  FENO 08/20/16- 14  Assessment:  Chronic cough Suspect this is likely upper airway cough syndrome from postnasal drip, GERD. His CT scan done last month is reassuring as it does not show any acute lung abnormality. I do not suspect asthma as the FENO in office is low and clinical picture does not fit. Parkinson's causing vocal cord issues, recurrent aspiration is a possibility and he follows with speech pathology.   We discussed the perpetrating nature of cough with damage to the epithelium due to recurrent insults from GERD and post nasal drip.  He has been on ACE  inhibitor for many years but I recommend holding it at least temporarily while he recovers from his cough. He will use HCTZ and monitor his BP closely. If there is an increase in BP he will discuss with PCP for alternative meds. I educated him on behavioral changes to deal with cough including conscious suppression of the urge to cough, use of throat lozenges.   We will treat post nasal drip with chlorpheniramine and astelin spray. He will continue on prilosec for the GERD and albuterol PRN  OSA Untreated OSA can make the cough worse. We discussed CPAP treatment but he is not ready to try it yet.   Plan/Recommendations: - Start chlorpheniramine, astelin - Continue Prilosec, albuterol PRN - Stop lisinopril, continue HCTZ  Chilton GreathousePraveen Rafaella Kole MD [facility] Pager [phone] 08/20/2016, 3:19 PM  CC: Rankins, Fanny DanceVictoria R, MD

## 2016-09-16 ENCOUNTER — Ambulatory Visit (INDEPENDENT_AMBULATORY_CARE_PROVIDER_SITE_OTHER): Payer: 59 | Admitting: Family Medicine

## 2016-09-16 VITALS — BP 152/98 | HR 76 | Temp 98.2°F | Resp 16 | Ht 66.0 in | Wt 240.2 lb

## 2016-09-16 DIAGNOSIS — R600 Localized edema: Secondary | ICD-10-CM

## 2016-09-16 DIAGNOSIS — I10 Essential (primary) hypertension: Secondary | ICD-10-CM | POA: Diagnosis not present

## 2016-09-16 LAB — POCT URINALYSIS DIP (MANUAL ENTRY)
BILIRUBIN UA: NEGATIVE
Glucose, UA: NEGATIVE mg/dL
Ketones, POC UA: NEGATIVE mg/dL
Leukocytes, UA: NEGATIVE
Nitrite, UA: NEGATIVE
PH UA: 6.5 (ref 5.0–8.0)
Protein Ur, POC: NEGATIVE mg/dL
RBC UA: NEGATIVE
SPEC GRAV UA: 1.02 (ref 1.010–1.025)
UROBILINOGEN UA: 0.2 U/dL

## 2016-09-16 LAB — POCT CBC
GRANULOCYTE PERCENT: 68.2 % (ref 37–80)
HCT, POC: 44.3 % (ref 43.5–53.7)
HEMOGLOBIN: 15 g/dL (ref 14.1–18.1)
Lymph, poc: 1.9 (ref 0.6–3.4)
MCH: 27.7 pg (ref 27–31.2)
MCHC: 33.9 g/dL (ref 31.8–35.4)
MCV: 81.7 fL (ref 80–97)
MID (cbc): 0.3 (ref 0–0.9)
MPV: 8.2 fL (ref 0–99.8)
PLATELET COUNT, POC: 271 10*3/uL (ref 142–424)
POC Granulocyte: 4.6 (ref 2–6.9)
POC LYMPH PERCENT: 27.6 %L (ref 10–50)
POC MID %: 4.2 %M (ref 0–12)
RBC: 5.42 M/uL (ref 4.69–6.13)
RDW, POC: 15.3 %
WBC: 6.8 10*3/uL (ref 4.6–10.2)

## 2016-09-16 NOTE — Patient Instructions (Addendum)
  It was very good to meet you today.  I don't have a clear answer for you today for your edema.  Your blood counts here as well as urinalysis are both within normal limits.  We also checked your thyroid and a chemistry panel which will come back some point tomorrow.  I will call you with these results.  For now let's increase your hydrochlorothiazide to 25 mg a day and wait to see what the blood work shows.   IF you received an x-ray today, you will receive an invoice from [facility]. Please contact [facility] at [phone] with questions or concerns regarding your invoice.   IF you received labwork today, you will receive an invoice from [facility]. Please contact [facility] at [phone] with questions or concerns regarding your invoice.   Our billing staff will not be able to assist you with questions regarding bills from these companies.  You will be contacted with the lab results as soon as they are available. The fastest way to get your results is to activate your My Chart account. Instructions are located on the last page of this paperwork. If you have not heard from [place] regarding the results in 2 weeks, please contact this office.

## 2016-09-16 NOTE — Progress Notes (Signed)
Stephen Best is a 61 y.o. male who presents to Primary Care at [facility] today for LE edema:  1.  LE edema:  Questionable history of LE edema for past several months according to patient.  However, starting today, he began having notable bilateral lower extremity edema. He initially thought that maybe it was secondary to compression from a bicycle shorts but even after taking this off the stenotic improved. He reports his had normal urinalysis in the past month at his urologist as well as normal CBC and Sinemet at outside PCP last October.  He does have recent history of chronic cough.  The only medication changes he has had is that he was switched from a lisinopril/hydrochlorothiazide combination to simply hydrochlorothiazide itself. This occurred about a month ago. No change in LE edema until today.  No history of heart failure.  No history of liver issues.    He does have in his words "questionable history of sleep apnea." No increase in NSAID use. No decreased urination. No dyspnea. No chest pain. No palpitations. No cough. Good sensation bilateral lower extremities.   ROS as above.  Pertinently, no chest pain, palpitations, SOB, Fever, Chills, Abd pain, N/V/D.   PMH reviewed. Patient is a nonsmoker.   Past Medical History:  Diagnosis Date  . Degenerative arthritis   . HTN (hypertension)   . Movement disorder   . Obesity   . Parkinson disease (HCC) 05/02/2014  . Reflux   . Restless leg syndrome 05/02/2014  . Vocal cord granuloma    Past Surgical History:  Procedure Laterality Date  . CHOLECYSTECTOMY    . KNEE SURGERY Right    Arthroscopic  . PAROTID GLAND TUMOR EXCISION     adenoma    Medications reviewed. Current Outpatient Prescriptions  Medication Sig Dispense Refill  . albuterol (PROVENTIL HFA;VENTOLIN HFA) 108 (90 Base) MCG/ACT inhaler Inhale 2 puffs into the lungs every 6 (six) hours as needed for wheezing or shortness of breath. 1 Inhaler 0  . carbidopa-levodopa  (SINEMET IR) 25-100 MG tablet TAKE ONE TABLET BY MOUTH THREE TIMES DAILY 270 tablet 2  . chlorpheniramine (CHLOR-TRIMETON) 4 MG tablet Take 2 tablets (8 mg total) by mouth 3 (three) times daily. 180 tablet 3  . cholecalciferol (VITAMIN D) 1000 UNITS tablet Take 2,000 Units by mouth daily.    . fesoterodine (TOVIAZ) 4 MG TB24 tablet Take 4 mg by mouth daily.    . hydrochlorothiazide (MICROZIDE) 12.5 MG capsule Take 1 capsule (12.5 mg total) by mouth daily. 30 capsule 4  . omeprazole (PRILOSEC) 40 MG capsule Take 40 mg by mouth daily.    . Pramipexole Dihydrochloride 4.5 MG TB24 TAKE 1 TABLET (4.5 MG TOTAL) BY MOUTH DAILY AFTER SUPPER. 30 tablet 11  . lisinopril-hydrochlorothiazide (PRINZIDE,ZESTORETIC) 20-12.5 MG per tablet Take 1 tablet by mouth daily.     No current facility-administered medications for this visit.      Physical Exam:  BP (!) 144/84 (BP Location: Right Arm, Patient Position: Sitting, Cuff Size: Large)   Pulse 76   Temp 98.2 F (36.8 C) (Oral)   Resp 16   Ht  (1.676 m)   Wt 240 lb 3.2 oz (109 kg)   SpO2 94%   BMI 38.77 kg/m  Gen:  Alert, cooperative patient who appears stated age in no acute distress.  Vital signs reviewed. HEENT: EOMI,  MMM. Neck:  No JVD.  No Cervical LAD Pulm:  Clear to auscultation bilaterally with good air movement.  No wheezes  or rales noted.   Cardiac:  Regular rate and rhythm without murmur auscultated.  Good S1/S2. Abd:  Soft/nondistended/nontender.  Good bowel sounds throughout all four quadrants.  No masses noted.  Exts: +4 edema BL ankles.  +2 edema extending to mid-shins.  No bruising or redness or undue warmth to BL LE's.   Assessment and Plan:  1.  LE edema: - unclear etiology. Based on his history, doesn't sound like this is chronic issue.  Sounds like it has been acute today.   Would be odd for edema to start after stopping ACE-I.   - He looks very well otherwise.  - checking TSH, CMET, BNP here.  - CBC was completely  WNL - U/A without proteinuria.   - physical exam was completely WNL except for LE edema.   - he's not been as active as usual.  He is obese.  Perhaps component of venous insufficiency playing role as well.   - No red flags, no evidence of DVT, no recent prolonged travel, etc.   - Possibly complication of untreated OSA

## 2016-09-17 ENCOUNTER — Encounter: Payer: Self-pay | Admitting: Family Medicine

## 2016-09-17 ENCOUNTER — Telehealth: Payer: Self-pay | Admitting: Family Medicine

## 2016-09-17 LAB — COMPREHENSIVE METABOLIC PANEL
A/G RATIO: 1.9 (ref 1.2–2.2)
ALBUMIN: 4.2 g/dL (ref 3.5–5.5)
ALT: 21 IU/L (ref 0–44)
AST: 16 IU/L (ref 0–40)
Alkaline Phosphatase: 70 IU/L (ref 39–117)
BUN / CREAT RATIO: 11 (ref 9–20)
BUN: 10 mg/dL (ref 6–24)
Bilirubin Total: 0.4 mg/dL (ref 0.0–1.2)
CALCIUM: 9.2 mg/dL (ref 8.7–10.2)
CO2: 29 mmol/L (ref 18–29)
Chloride: 98 mmol/L (ref 96–106)
Creatinine, Ser: 0.94 mg/dL (ref 0.76–1.27)
GFR, EST AFRICAN AMERICAN: 102 mL/min/{1.73_m2} (ref >59)
GFR, EST NON AFRICAN AMERICAN: 88 mL/min/{1.73_m2} (ref >59)
GLOBULIN, TOTAL: 2.2 g/dL (ref 1.5–4.5)
Glucose: 87 mg/dL (ref 65–99)
POTASSIUM: 3.8 mmol/L (ref 3.5–5.2)
Sodium: 143 mmol/L (ref 134–144)
TOTAL PROTEIN: 6.4 g/dL (ref 6.0–8.5)

## 2016-09-17 LAB — BRAIN NATRIURETIC PEPTIDE: BNP: 9.6 pg/mL (ref 0.0–100.0)

## 2016-09-17 LAB — TSH: TSH: 1.18 u[IU]/mL (ref 0.450–4.500)

## 2016-09-17 NOTE — Telephone Encounter (Signed)
Called and discussed lab results with patient.  No clear etiology of LE edema.  Venous insufficiency still a concern.  Plan to treat with ambulation and elevation.  Will also see how things go with increased dose of HCTZ.  If still with LE edema in 2 weeks, FU for recheck. If worsening, not to wait.

## 2016-09-18 ENCOUNTER — Telehealth: Payer: Self-pay | Admitting: General Practice

## 2016-09-18 NOTE — Telephone Encounter (Signed)
Pt  Pharmacy is checking on status of refill request from [facility]

## 2016-09-18 NOTE — Telephone Encounter (Signed)
What medicine?

## 2016-09-24 ENCOUNTER — Encounter: Payer: Self-pay | Admitting: Pulmonary Disease

## 2016-09-24 ENCOUNTER — Ambulatory Visit (INDEPENDENT_AMBULATORY_CARE_PROVIDER_SITE_OTHER): Payer: 59 | Admitting: Pulmonary Disease

## 2016-09-24 VITALS — BP 124/80 | HR 97 | Ht 66.0 in | Wt 236.2 lb

## 2016-09-24 DIAGNOSIS — R05 Cough: Secondary | ICD-10-CM

## 2016-09-24 DIAGNOSIS — R053 Chronic cough: Secondary | ICD-10-CM

## 2016-09-24 NOTE — Patient Instructions (Signed)
Please start your new blood pressure medication ordered by your primary care Monitor your cough. please call us if there is any worsening Return to clinic in 3 months.

## 2016-09-24 NOTE — Progress Notes (Signed)
Stephen Best    [id number]    1955/12/23  Primary Care Physician:Victoria R Rankins, MD  Referring Physician: Clayborn Heron, MD [address] [place], [place] [address]  Chief complaint:   Follow up for Cough, Upper airway cough, ACEI mediated Untreated OSA.  HPI: 61 year old with past medical history of hypertension, hyperlipidemia, Parkinson's, GERD, vocal cord polyp. He has complains of paroxysmal cough since Thanksgiving this is nonproductive in nature not associated with dyspnea. He has occasional wheezing. There is no sputum production, fevers, chills. He has been treated with Robitussin, over-the-counter antihistamine, Astelin, astelin and Hycodan without any improvement in symptoms. He used albuterol inhaler with some improvement in symptoms.   He has history of acid reflux with esophagitis. He's had esophageal manometry done in the past at [facility] gastroenterology. 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 This has been resected and his last ENT examination shows no evidence of granuloma. He has occasional seasonal allergy, postnasal drip. He is a nonsmoker. 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  He has symptoms of OSA and a sleep study in December 2017 which showed severe sleep apnea with a CPAP was recommended but the patient feels that study is abnormal due to issues with cough, nasal congestion has declined CPAP therapy.  Interim History: He was taken off lisinopril, HCTZ but pressure increased to 150 systolic with lower extremity edema. He restarted the hydrochlorothiazide but blood pressure continued to be high so he went back on the lisinopril, HCTZ few days ago. He saw his primary care yesterday and was given a prescription for losatan, HCTZ but has not started it yet. States that the cough is improved  Outpatient Encounter Prescriptions as of 09/24/2016  Medication Sig  . albuterol (PROVENTIL  HFA;VENTOLIN HFA) 108 (90 Base) MCG/ACT inhaler Inhale 2 puffs into the lungs every 6 (six) hours as needed for wheezing or shortness of breath.  . carbidopa-levodopa (SINEMET IR) 25-100 MG tablet TAKE ONE TABLET BY MOUTH THREE TIMES DAILY  . cholecalciferol (VITAMIN D) 1000 UNITS tablet Take 2,000 Units by mouth daily.  . fesoterodine (TOVIAZ) 4 MG TB24 tablet Take 4 mg by mouth daily.  . hydrochlorothiazide (MICROZIDE) 12.5 MG capsule Take 1 capsule (12.5 mg total) by mouth daily.  Marland Kitchen lisinopril-hydrochlorothiazide (PRINZIDE,ZESTORETIC) 20-12.5 MG per tablet Take 1 tablet by mouth daily.  Marland Kitchen omeprazole (PRILOSEC) 40 MG capsule Take 40 mg by mouth daily.  . Pramipexole Dihydrochloride 4.5 MG TB24 TAKE 1 TABLET (4.5 MG TOTAL) BY MOUTH DAILY AFTER SUPPER.  . chlorpheniramine (CHLOR-TRIMETON) 4 MG tablet Take 2 tablets (8 mg total) by mouth 3 (three) times daily. (Patient not taking: Reported on 09/24/2016)   No facility-administered encounter medications on file as of 09/24/2016.     Allergies as of 09/24/2016  . (No Known Allergies)    Past Medical History:  Diagnosis Date  . Degenerative arthritis   . HTN (hypertension)   . Movement disorder   . Obesity   . Parkinson disease (HCC) 05/02/2014  . Reflux   . Restless leg syndrome 05/02/2014  . Vocal cord granuloma     Past Surgical History:  Procedure Laterality Date  . CHOLECYSTECTOMY    . KNEE SURGERY Right    Arthroscopic  . PAROTID GLAND TUMOR EXCISION     adenoma    Family History  Problem Relation Age of Onset  . Pancreatic cancer Mother   .  Heart failure Father   . Kidney disease Father   . Dementia Father   . Diabetes Father   . Hypertension Brother   . Parkinsonism Neg Hx     Social History   Social History  . Marital status: Divorced    Spouse name: N/A  . Number of children: 2  . Years of education: Post Grad   Occupational History  . Semi-retired per patient    Social History Main Topics  . Smoking  status: Never Smoker  . Smokeless tobacco: Never Used  . Alcohol use Yes  . Drug use: No  . Sexual activity: Not on file   Other Topics Concern  . Not on file   Social History Narrative   Drinks 2 caffeine drinks in the morning    Review of systems: Review of Systems  Constitutional: Negative for fever and chills.  HENT: Negative.   Eyes: Negative for blurred vision.  Respiratory: as per HPI  Cardiovascular: Negative for chest pain and palpitations.  Gastrointestinal: Negative for vomiting, diarrhea, blood per rectum. Genitourinary: Negative for dysuria, urgency, frequency and hematuria.  Musculoskeletal: Negative for myalgias, back pain and joint pain.  Skin: Negative for itching and rash.  Neurological: Negative for dizziness, tremors, focal weakness, seizures and loss of consciousness.  Endo/Heme/Allergies: Negative for environmental allergies.  Psychiatric/Behavioral: Negative for depression, suicidal ideas and hallucinations.  All other systems reviewed and are negative.  Physical Exam: Blood pressure 124/80, pulse 97, height  (1.676 m), weight 236 lb 3.2 oz (107.1 kg), SpO2 90 %. Gen:      No acute distress HEENT:  EOMI, sclera anicteric Neck:     No masses; no thyromegaly Lungs:    Clear to auscultation bilaterally; normal respiratory effort CV:         Regular rate and rhythm; no murmurs Abd:      + bowel sounds; soft, non-tender; no palpable masses, no distension Ext:    No edema; adequate peripheral perfusion Skin:      Warm and dry; no rash Neuro: alert and oriented x 3 Psych: normal mood and affect Data Reviewed: Sleep study 05/29/16 Severe sleep apnea with total AHI 33.3, RDI 33.3. Lowest O2 sats 81% CPAP titrated to 7 cm Severe PLM's were noted.  Chest x-ray 07/25/16-clear lungs. Question of left nipple shadow. CT chest 07/25/16-minimal scarring, atelectasis at the bases. 4 mm nodule in the minor fissure I have reviewed all images personally.  FENO  08/20/16- 14  Assessment:  Chronic cough Suspect this is likely upper airway cough syndrome from postnasal drip, GERD. His CT scan is reassuring as it does not show any acute lung abnormality. I do not suspect asthma as the FENO in office is low and clinical picture does not fit. Parkinson's causing vocal cord issues, recurrent aspiration is a possibility and he follows with speech pathology.   We discussed the perpetrating nature of cough with damage to the epithelium due to recurrent insults from GERD and post nasal drip.  He has been on ACE inhibitor for many years but his cough appears to have improved when he stopped it temporarily. He will switch over to losartan, hydrochlorothiazide for blood pressure control. I educated him on behavioral changes to deal with cough including conscious suppression of the urge to cough, use of throat lozenges.   He will continue treatment for post nasal drip with chlorpheniramine and astelin spray. He will continue on prilosec for the GERD and albuterol PRN  OSA Untreated OSA can  make the cough worse. We discussed CPAP treatment but he is not ready to try it yet.   Plan/Recommendations: - Continue chlorpheniramine, astelin - Continue Prilosec, albuterol PRN - Start losartan instead of lisinopril  Chilton Greathouse MD [facility] Pager [phone] 09/24/2016, 2:30 PM  CC: Rankins, Fanny Dance, MD

## 2016-12-24 ENCOUNTER — Ambulatory Visit: Payer: 59 | Admitting: Pulmonary Disease

## 2016-12-31 ENCOUNTER — Other Ambulatory Visit: Payer: Self-pay | Admitting: Internal Medicine

## 2016-12-31 DIAGNOSIS — R0600 Dyspnea, unspecified: Secondary | ICD-10-CM

## 2016-12-31 DIAGNOSIS — R600 Localized edema: Secondary | ICD-10-CM

## 2017-01-05 ENCOUNTER — Other Ambulatory Visit: Payer: Self-pay

## 2017-01-05 ENCOUNTER — Ambulatory Visit (HOSPITAL_COMMUNITY): Payer: 59 | Attending: Cardiology

## 2017-01-05 DIAGNOSIS — R0600 Dyspnea, unspecified: Secondary | ICD-10-CM | POA: Diagnosis present

## 2017-01-05 DIAGNOSIS — R600 Localized edema: Secondary | ICD-10-CM

## 2017-01-05 DIAGNOSIS — I253 Aneurysm of heart: Secondary | ICD-10-CM | POA: Diagnosis not present

## 2017-01-13 ENCOUNTER — Ambulatory Visit (INDEPENDENT_AMBULATORY_CARE_PROVIDER_SITE_OTHER): Payer: 59 | Admitting: Neurology

## 2017-01-13 ENCOUNTER — Encounter: Payer: Self-pay | Admitting: Neurology

## 2017-01-13 VITALS — BP 128/90 | HR 84 | Ht 66.0 in | Wt 235.5 lb

## 2017-01-13 DIAGNOSIS — G20 Parkinson's disease: Secondary | ICD-10-CM | POA: Diagnosis not present

## 2017-01-13 DIAGNOSIS — G20A1 Parkinson's disease without dyskinesia, without mention of fluctuations: Secondary | ICD-10-CM

## 2017-01-13 DIAGNOSIS — G2581 Restless legs syndrome: Secondary | ICD-10-CM | POA: Diagnosis not present

## 2017-01-13 MED ORDER — CARBIDOPA-LEVODOPA 25-100 MG PO TABS: ORAL_TABLET | ORAL | 2 refills | Status: DC

## 2017-01-13 MED ORDER — PRAMIPEXOLE DIHYDROCHLORIDE ER 4.5 MG PO TB24: 4.5000 mg | ORAL_TABLET | Freq: Every day | ORAL | 3 refills | Status: DC

## 2017-01-13 NOTE — Progress Notes (Signed)
Reason for visit: Parkinson's disease  Stephen Best is an 61 y.o. male  History of present illness:  Stephen Best is a 61 year old right-handed white male with a history of mild Parkinson's disease. He currently is on long-acting Mirapex and on Sinemet taking the 25/100 mg tablets, one tablet 3 times daily. He retired in December 2017. He currently is not working. He is having ongoing problems with excessive daytime drowsiness, he has had a sleep evaluation and he decided not to go on CPAP. He has had some problems with swelling in the legs and feet since February 2018 after he was taken off of lisinopril secondary to excessive coughing. His blood pressure elevated, and he was placed on losartan as well as Hydrochlorothiazide. His swelling has continued. A cardiac workup did not show any evidence of severe cardiac dysfunction. The patient has some pain in the feet associated with swelling but also has pain in the big toe areas, the pain is associated with walking, and is better when he is off of his feet. The patient has difficulty controlling the right hand at times when using a mouse. He denies any change in balance, he is not having falls. He returns to the office today for an evaluation.  Past Medical History:  Diagnosis Date  . Degenerative arthritis   . HTN (hypertension)   . Movement disorder   . Obesity   . Parkinson disease (HCC) 05/02/2014  . Reflux   . Restless leg syndrome 05/02/2014  . Vocal cord granuloma     Past Surgical History:  Procedure Laterality Date  . CHOLECYSTECTOMY    . KNEE SURGERY Right    Arthroscopic  . PAROTID GLAND TUMOR EXCISION     adenoma    Family History  Problem Relation Age of Onset  . Pancreatic cancer Mother   . Heart failure Father   . Kidney disease Father   . Dementia Father   . Diabetes Father   . Hypertension Brother   . Parkinsonism Neg Hx     Social history:  reports that he has never smoked. He has never used smokeless  tobacco. He reports that he drinks alcohol. He reports that he does not use drugs.   No Known Allergies  Medications:  Prior to Admission medications   Medication Sig Start Date End Date Taking? Authorizing Provider  carbidopa-levodopa (SINEMET IR) 25-100 MG tablet TAKE ONE TABLET BY MOUTH THREE TIMES DAILY 06/30/16  Yes York SpanielWillis, Sharra Cayabyab K, MD  cholecalciferol (VITAMIN D) 1000 UNITS tablet Take 2,000 Units by mouth daily.   Yes [provider]  fesoterodine (TOVIAZ) 4 MG TB24 tablet Take 4 mg by mouth daily.   Yes [provider]  losartan-hydrochlorothiazide (HYZAAR) 100-25 MG tablet Take 1 tablet by mouth daily. 11/19/16  Yes [provider]  omeprazole (PRILOSEC) 40 MG capsule Take 40 mg by mouth daily.   Yes [provider]  Pramipexole Dihydrochloride 4.5 MG TB24 TAKE 1 TABLET (4.5 MG TOTAL) BY MOUTH DAILY AFTER SUPPER. 03/16/16  Yes York SpanielWillis, Elivia Robotham K, MD  albuterol (PROVENTIL HFA;VENTOLIN HFA) 108 (90 Base) MCG/ACT inhaler Inhale 2 puffs into the lungs every 6 (six) hours as needed for wheezing or shortness of breath. Patient not taking: Reported on 01/13/2017 08/20/16   Chilton GreathouseMannam, Praveen, MD    ROS:  Out of a complete 14 system review of symptoms, the patient complains only of the following symptoms, and all other reviewed systems are negative.  Weight gain, fatigue Swelling in the legs  Ringing in the ears Memory loss, tremor Sleepiness, snoring, restless legs  Blood pressure 128/90, pulse 84, height 5\' 6"  (1.676 m), weight 235 lb 8 oz (106.8 kg).  Physical Exam  General: The patient is alert and cooperative at the time of the examination. The patient is moderately obese.  Skin: 2+ edema below the knees is seen bilaterally.   Neurologic Exam  Mental status: The patient is alert and oriented x 3 at the time of the examination. The patient has apparent normal recent and remote memory, with an apparently normal attention span and concentration  ability.   Cranial nerves: Facial symmetry is present. Speech is normal, no aphasia or dysarthria is noted. Extraocular movements are full. Visual fields are full.  Motor: The patient has good strength in all 4 extremities.  Sensory examination: Soft touch sensation is symmetric on the face, arms, and legs.  Coordination: The patient has good finger-nose-finger and heel-to-shin bilaterally.  Gait and station: The patient has a normal gait, but his right arm does not swing as well as the left. He is able to arise from a seated position with arms crossed. Tandem gait is normal. Romberg is negative. No drift is seen.  Reflexes: Deep tendon reflexes are symmetric.   Assessment/Plan:  1. Parkinson's disease  2. Peripheral edema  3. Excessive daytime drowsiness, sleep apnea  4. Restless leg syndrome  The patient has minimal symptoms of Parkinson's disease. He does have some decreased arm swing on the right, but otherwise no significant symptoms that would be suggestive of Parkinson's. He will be reduced on the Sinemet taking one half of a 25/100 mg tablet 3 times daily. It is possible that the Mirapex and Sinemet may be contributing to the leg swelling and the excessive daytime drowsiness. The patient does have sleep apnea, he is not on CPAP. He will follow-up in 6 months. A prescription was sent in for the Mirapex. We may consider a DAT scan in the future to determine whether or not the patient actually has Parkinson's disease. The diagnosis of Parkinson's disease was given through [facility].  Marlan Palau MD 01/13/2017 10:31 AM  [facility] [address] [place], [place] [address]  Phone [phone] Fax [phone]

## 2017-01-13 NOTE — Patient Instructions (Signed)
   We will cut back on the Sinemet 25/100 to 1/2 tablet three times a day.

## 2017-01-26 ENCOUNTER — Telehealth: Payer: Self-pay | Admitting: *Deleted

## 2017-01-26 NOTE — Telephone Encounter (Signed)
Submitted PA for pramipexole ER on covermymeds to [facility]. Key: [id number] - PA Case ID: [id number]. Awaiting response from insurance.

## 2017-01-26 NOTE — Telephone Encounter (Signed)
Received notice PA approved. Request Reference Number: [id number]. PRAMIPEXOLE TAB 4.5MG  ER is approved through 01/26/2018. For further questions, call [phone].  Also received fax notification from [place] [employer] about approval.  Faxed notification of approval to [facility] [address] in [place]. Fax: [phone]. Received confirmation.

## 2017-01-27 ENCOUNTER — Ambulatory Visit (INDEPENDENT_AMBULATORY_CARE_PROVIDER_SITE_OTHER): Payer: 59 | Admitting: Podiatry

## 2017-01-27 ENCOUNTER — Other Ambulatory Visit: Payer: Self-pay | Admitting: Podiatry

## 2017-01-27 ENCOUNTER — Ambulatory Visit (INDEPENDENT_AMBULATORY_CARE_PROVIDER_SITE_OTHER): Payer: 59

## 2017-01-27 ENCOUNTER — Encounter: Payer: Self-pay | Admitting: Podiatry

## 2017-01-27 VITALS — BP 134/80 | HR 81 | Resp 16

## 2017-01-27 DIAGNOSIS — M2021 Hallux rigidus, right foot: Secondary | ICD-10-CM

## 2017-01-27 DIAGNOSIS — M79671 Pain in right foot: Secondary | ICD-10-CM | POA: Diagnosis not present

## 2017-01-27 DIAGNOSIS — M2022 Hallux rigidus, left foot: Secondary | ICD-10-CM

## 2017-01-27 DIAGNOSIS — M79672 Pain in left foot: Secondary | ICD-10-CM

## 2017-01-27 NOTE — Progress Notes (Signed)
Subjective:    Patient ID: Stephen Best, male   DOB: 61 y.o.   MRN: [id number]   HPI patient presents stating he's had problems with his big toe joints for a long time and his right one has practically fused and is not as tender but he's having a lot of pain in his left big toe joint and he's tried shoe gear modifications he's tried soaks and anti-inflammatories without relief and it's been going on for several years    Review of Systems  All other systems reviewed and are negative.       Objective:  Physical Exam  Constitutional: He appears well-developed and well-nourished.  Cardiovascular: Intact distal pulses.   Musculoskeletal: Normal range of motion.  Neurological: He is alert.  Skin: Skin is warm.  Nursing note and vitals reviewed.  neurovascular status intact muscle strength adequate range of motion within normal limits with patient found to have loss range of motion right over left with dorsiflexion of approximately 5 on the right 15 and the left with no crepitus currently within the joint but quite a bit of discomfort when I pressed into the first metatarsophalangeal joint left over right. Patient's found have good digital perfusion and is well oriented     Assessment:   Hallux limitus rigidus condition right over left with functional problem on the left it's becoming increasingly painful      Plan:    H&P condition reviewed with patient. At this point we discussed options conservatively and surgically and he states that due to inability to get better from a previous conservative standpoint he would rather go ahead and get this corrected. I recommended a biplanar osteotomy and I discussed there is no long-term guarantees and the possibilities exist for fusion or joint implantation long-term. He needs to do it as soon as possible as his daughter is getting married and at this point I did go ahead and I allowed him to read a consent form reviewing at length all possible  complications and alternative treatments. He understands recovery can take approximate 6 months to one year long-term and there is no long-term guarantees and I also went ahead and I dispensed air fracture walker with all instructions on usage. Patient is scheduled for outpatient surgery in is encouraged to call with any questions  X-rays indicate that there is spurring of the first MPJ right over left with narrowing of the joint surface right over left and indications of hallux limitus rigidus condition bilateral

## 2017-01-27 NOTE — Progress Notes (Signed)
   Subjective:    Patient ID: Stephen Best, male    DOB: August 20, 1955, 61 y.o.   MRN: [id number]  HPI Chief Complaint  Patient presents with  . Foot Pain    Bilateral; dorsal and great toe-joint; pt stated, "Left foot is worse than right foot"; x4 months      Review of Systems  Cardiovascular: Positive for leg swelling.  Musculoskeletal: Positive for arthralgias, back pain and gait problem.  Neurological: Positive for tremors.  All other systems reviewed and are negative.      Objective:   Physical Exam        Assessment & Plan:

## 2017-01-27 NOTE — Patient Instructions (Signed)
Pre-Operative Instructions  Congratulations, you have decided to take an important step towards improving your quality of life.  You can be assured that the doctors and staff at [facility] will be with you every step of the way.  Here are some important things you should know:  1. Plan to be at the surgery center/hospital at least 1 (one) hour prior to your scheduled time, unless otherwise directed by the surgical center/hospital staff.  You must have a responsible adult accompany you, remain during the surgery and drive you home.  Make sure you have directions to the surgical center/hospital to ensure you arrive on time. 2. If you are having surgery at [facility] or [facility] hospitals, you will need a copy of your medical history and physical form from your family physician within one month prior to the date of surgery. We will give you a form for your primary physician to complete.  3. We make every effort to accommodate the date you request for surgery.  However, there are times where surgery dates or times have to be moved.  We will contact you as soon as possible if a change in schedule is required.   4. No aspirin/ibuprofen for one week before surgery.  If you are on aspirin, any non-steroidal anti-inflammatory medications (Mobic, Aleve, Ibuprofen) should not be taken seven (7) days prior to your surgery.  You make take Tylenol for pain prior to surgery.  5. Medications - If you are taking daily heart and blood pressure medications, seizure, reflux, allergy, asthma, anxiety, pain or diabetes medications, make sure you notify the surgery center/hospital before the day of surgery so they can tell you which medications you should take or avoid the day of surgery. 6. No food or drink after midnight the night before surgery unless directed otherwise by surgical center/hospital staff. 7. No alcoholic beverages 24-hours prior to surgery.  No smoking 24-hours prior or 24-hours after  surgery. 8. Wear loose pants or shorts. They should be loose enough to fit over bandages, boots, and casts. 9. Don't wear slip-on shoes. Sneakers are preferred. 10. Bring your boot with you to the surgery center/hospital.  Also bring crutches or a walker if your physician has prescribed it for you.  If you do not have this equipment, it will be provided for you after surgery. 11. If you have not been contacted by the surgery center/hospital by the day before your surgery, call to confirm the date and time of your surgery. 12. Leave-time from work may vary depending on the type of surgery you have.  Appropriate arrangements should be made prior to surgery with your employer. 13. Prescriptions will be provided immediately following surgery by your doctor.  Fill these as soon as possible after surgery and take the medication as directed. Pain medications will not be refilled on weekends and must be approved by the doctor. 14. Remove nail polish on the operative foot and avoid getting pedicures prior to surgery. 15. Wash the night before surgery.  The night before surgery wash the foot and leg well with water and the antibacterial soap provided. Be sure to pay special attention to beneath the toenails and in between the toes.  Wash for at least three (3) minutes. Rinse thoroughly with water and dry well with a towel.  Perform this wash unless told not to do so by your physician.  Enclosed: 1 Ice pack (please put in freezer the night before surgery)   1 Hibiclens skin cleaner     Pre-op instructions  If you have any questions regarding the instructions, please do not hesitate to call our office.  [place]: [address], [place], [place] [address] -- [phone]  [place]: [address] [place], [place] [address] -- [phone]  [place]: [address]  [place], [place] [address] -- [phone]  [place]: [address], [address], [place],  [address] -- [phone]  Website:  [email or web address] 

## 2017-02-01 ENCOUNTER — Telehealth: Payer: Self-pay | Admitting: *Deleted

## 2017-02-01 NOTE — Telephone Encounter (Signed)
I am calling you in regards to your insurance.  You're scheduled for surgery on September 4.  You insurance company is showing that your policy will end on 02/05/2017.  "There's no reason why it should be terminated.  I'll check in to it and give you a call back.

## 2017-02-04 ENCOUNTER — Ambulatory Visit (INDEPENDENT_AMBULATORY_CARE_PROVIDER_SITE_OTHER): Payer: 59 | Admitting: Pulmonary Disease

## 2017-02-04 ENCOUNTER — Encounter: Payer: Self-pay | Admitting: Pulmonary Disease

## 2017-02-04 VITALS — BP 128/76 | HR 78 | Ht 66.5 in | Wt 236.8 lb

## 2017-02-04 DIAGNOSIS — R05 Cough: Secondary | ICD-10-CM | POA: Diagnosis not present

## 2017-02-04 DIAGNOSIS — R053 Chronic cough: Secondary | ICD-10-CM

## 2017-02-04 NOTE — Patient Instructions (Addendum)
I'm glad that your cough is better Please follow up with her primary care regarding reinitiation of CPAP for sleep apnea  Return to clinic as needed

## 2017-02-04 NOTE — Progress Notes (Signed)
Stephen Best    [id number]    09-14-1955  Primary Care Physician:Polite, Windy Fastonald, MD  Referring Physician: Clayborn Heronankins, Victoria R, MD [address] [place], [place] [address]  Chief complaint:   Follow up for Cough, ACEI mediated Untreated OSA.  HPI: 61 year old with past medical history of hypertension, hyperlipidemia, Parkinson's, GERD, vocal cord polyp. He has complains of paroxysmal cough since Thanksgiving this is nonproductive in nature not associated with dyspnea. He has occasional wheezing. There is no sputum production, fevers, chills. He has been treated with Robitussin, over-the-counter antihistamine, Astelin, astelin and Hycodan without any improvement in symptoms. He used albuterol inhaler with some improvement in symptoms.   He has history of acid reflux with esophagitis. He's had esophageal [facility]. 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 This has been resected and his last ENT examination shows no evidence of granuloma. He has occasional seasonal allergy, postnasal drip. He is a nonsmoker. 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  He has symptoms of OSA and a sleep study in December 2017 which showed severe sleep apnea with a CPAP was recommended but the patient feels that study is abnormal due to issues with cough, nasal congestion has declined CPAP therapy.  Interim History: States that he does not have any cough. Denies any cough, sputum production, dyspnea.  Outpatient Encounter Prescriptions as of 02/04/2017  Medication Sig  . albuterol (PROVENTIL HFA;VENTOLIN HFA) 108 (90 Base) MCG/ACT inhaler Inhale 2 puffs into the lungs every 6 (six) hours as needed for wheezing or shortness of breath.  . carbidopa-levodopa (SINEMET IR) 25-100 MG tablet 1/2 tablet three times a day  . cholecalciferol (VITAMIN D) 1000 UNITS tablet Take 2,000 Units by mouth daily.  .  fesoterodine (TOVIAZ) 4 MG TB24 tablet Take 4 mg by mouth daily.  . furosemide (LASIX) 20 MG tablet   . losartan (COZAAR) 100 MG tablet   . omeprazole (PRILOSEC) 40 MG capsule Take 40 mg by mouth daily.  . Pramipexole Dihydrochloride 4.5 MG TB24 Take 1 tablet (4.5 mg total) by mouth daily after supper.   No facility-administered encounter medications on file as of 02/04/2017.     Allergies as of 02/04/2017  . (No Known Allergies)    Past Medical History:  Diagnosis Date  . Degenerative arthritis   . HTN (hypertension)   . Movement disorder   . Obesity   . Parkinson disease (HCC) 05/02/2014  . Reflux   . Restless leg syndrome 05/02/2014  . Vocal cord granuloma     Past Surgical History:  Procedure Laterality Date  . CHOLECYSTECTOMY    . KNEE SURGERY Right    Arthroscopic  . PAROTID GLAND TUMOR EXCISION     adenoma    Family History  Problem Relation Age of Onset  . Pancreatic cancer Mother   . Heart failure Father   . Kidney disease Father   . Dementia Father   . Diabetes Father   . Hypertension Brother   . Parkinsonism Neg Hx     Social History   Social History  . Marital status: Divorced    Spouse name: N/A  . Number of children: 2  . Years of education: Post Grad   Occupational History  . Semi-retired per patient    Social History Main Topics  . Smoking status: Never Smoker  . Smokeless tobacco: Never Used  . Alcohol  use Yes  . Drug use: No  . Sexual activity: Not on file   Other Topics Concern  . Not on file   Social History Narrative   Drinks 2 caffeine drinks in the morning    Review of systems: Review of Systems  Constitutional: Negative for fever and chills.  HENT: Negative.   Eyes: Negative for blurred vision.  Respiratory: as per HPI  Cardiovascular: Negative for chest pain and palpitations.  Gastrointestinal: Negative for vomiting, diarrhea, blood per rectum. Genitourinary: Negative for dysuria, urgency, frequency and hematuria.    Musculoskeletal: Negative for myalgias, back pain and joint pain.  Skin: Negative for itching and rash.  Neurological: Negative for dizziness, tremors, focal weakness, seizures and loss of consciousness.  Endo/Heme/Allergies: Negative for environmental allergies.  Psychiatric/Behavioral: Negative for depression, suicidal ideas and hallucinations.  All other systems reviewed and are negative.  Physical Exam: Blood pressure 124/80, pulse 97, height 5\' 6"  (1.676 m), weight 236 lb 3.2 oz (107.1 kg), SpO2 90 %. Gen:      No acute distress HEENT:  EOMI, sclera anicteric Neck:     No masses; no thyromegaly Lungs:    Clear to auscultation bilaterally; normal respiratory effort CV:         Regular rate and rhythm; no murmurs Abd:      + bowel sounds; soft, non-tender; no palpable masses, no distension Ext:    No edema; adequate peripheral perfusion Skin:      Warm and dry; no rash Neuro: alert and oriented x 3 Psych: normal mood and affect  Data Reviewed: Sleep study 05/29/16 Severe sleep apnea with total AHI 33.3, RDI 33.3. Lowest O2 sats 81% CPAP titrated to 7 cm Severe PLM's were noted.  Chest x-ray 07/25/16-clear lungs. Question of left nipple shadow. CT chest 07/25/16-minimal scarring, atelectasis at the bases. 4 mm nodule in the minor fissure I have reviewed all images personally.  FENO 08/20/16- 14  Assessment:  Chronic cough secondary to ACEI Cough is improved after lisinopril was switched to losartan His CT scan is reassuring as it does not show any acute lung abnormality. He does have a 4 mm nodule that is likely benign as he does not have any smoking history.  I do not suspect asthma as the FENO in office is low and clinical picture does not fit. Parkinson's causing vocal cord issues, recurrent aspiration is a possibility and he follows with speech pathology.   OSA Untreated OSA can make the cough worse. He is working with his primary care for reinitiation of  CPAP  Plan/Recommendations: - Follow up with primary care for treatment of OSA  Return to clinic as needed.  Chilton Greathouse MD [facility] Pager [phone] 02/04/2017, 3:14 PM  CC: Rankins, Fanny Dance, MD

## 2017-02-09 ENCOUNTER — Encounter: Payer: Self-pay | Admitting: Podiatry

## 2017-02-09 DIAGNOSIS — M2012 Hallux valgus (acquired), left foot: Secondary | ICD-10-CM | POA: Diagnosis not present

## 2017-02-09 NOTE — Telephone Encounter (Signed)
"  I had my supervisor to call and they informed her that everything was good with my insurance."  I will call instead of checking it online.  Surgery was authorized for September 4.  The authorization number is [id number].

## 2017-02-10 ENCOUNTER — Telehealth: Payer: Self-pay

## 2017-02-10 NOTE — Telephone Encounter (Signed)
Spoke with patient regarding post op status, he is doing well, very minimal pain. Has not needed any pain medications today. Advised him to stay in his boot at all times and minimize time on his foot to 15 min every hour. Continue with ice and elevation. He denied fever, chills or nausea. He is to call with any acute symptom changes

## 2017-02-12 NOTE — Progress Notes (Signed)
DOS 02/09/2017 [place] Bunionectomy Bi-Planar LT

## 2017-02-17 ENCOUNTER — Encounter: Payer: Self-pay | Admitting: Podiatry

## 2017-02-17 ENCOUNTER — Ambulatory Visit (INDEPENDENT_AMBULATORY_CARE_PROVIDER_SITE_OTHER): Payer: 59

## 2017-02-17 ENCOUNTER — Ambulatory Visit (INDEPENDENT_AMBULATORY_CARE_PROVIDER_SITE_OTHER): Payer: 59 | Admitting: Podiatry

## 2017-02-17 VITALS — BP 119/78 | HR 88 | Temp 97.9°F | Resp 16

## 2017-02-17 DIAGNOSIS — M2012 Hallux valgus (acquired), left foot: Secondary | ICD-10-CM

## 2017-02-17 NOTE — Progress Notes (Signed)
Subjective:    Patient ID: Stephen Best, male   DOB: 61 y.o.   MRN: [id number]   HPI patient states doing real well with his left foot    ROS      Objective:  Physical Exam neurovascular status intact negative Homans sign was noted with patient's first MPJ left healing well with wound edges well coapted hallux in rectus position and good range of motion with no crepitus     Assessment:   Doing well post osteotomy left      Plan:   H&P condition reviewed and recommended continued exercises for this along with range of motion and I advised on continued compression elevation immobilization. Reappoint 3 weeks or earlier if needed  X-rays indicate the osteotomy is healing well pins in place joint open and congruence

## 2017-02-18 ENCOUNTER — Telehealth: Payer: Self-pay | Admitting: Podiatry

## 2017-02-18 NOTE — Telephone Encounter (Signed)
Pt returned nurse Valery's phone call. Requested a call back.

## 2017-02-18 NOTE — Telephone Encounter (Addendum)
Left message to call back with concerns. I asked pt what type of dressing he was currently in and he stated a brown sock but no other dressing. I told pt to get the foot only shower wet, not standing water or water beating down on the surgery site. I told pt not to wear the compression sock to bed to put it on 1st thing in the morning and take it off at night, to shower at night so he could quickly put his compression sock on before getting out of bed. I told him if he felt the compression sock irritated the surgery site place a gauze pad or telfa under the compression sock covering the surgery site. Pt states understanding.

## 2017-02-18 NOTE — Telephone Encounter (Signed)
I saw Dr. Charlsie Merlesegal for my surgical re-check. I have a quick question about showers. How wet can I get the bandage over the foot. He said something about being able to get it wet but I'm not sure how wet he meant. Please check on that and give me a call back at [phone]. Thank you.

## 2017-03-11 ENCOUNTER — Ambulatory Visit (INDEPENDENT_AMBULATORY_CARE_PROVIDER_SITE_OTHER): Payer: 59

## 2017-03-11 ENCOUNTER — Encounter: Payer: Self-pay | Admitting: Podiatry

## 2017-03-11 ENCOUNTER — Ambulatory Visit (INDEPENDENT_AMBULATORY_CARE_PROVIDER_SITE_OTHER): Payer: Self-pay | Admitting: Podiatry

## 2017-03-11 VITALS — BP 120/81 | HR 82 | Resp 16

## 2017-03-11 DIAGNOSIS — M2012 Hallux valgus (acquired), left foot: Secondary | ICD-10-CM

## 2017-03-11 NOTE — Progress Notes (Signed)
Subjective:    Patient ID: Stephen Best, male   DOB: 61 y.o.   MRN: [id number]   HPI patient states doing really well with left foot with minimal discomfort and able to walk    ROS      Objective:  Physical Exam neurovascular status intact negative Homans sign noted wound edges well coapted with good alignment of the first MPJ good range of motion and no crepitus of the joint     Assessment:    Doing well post distal osteotomy first metatarsal left     Plan:  H&P x-rays reviewed compression stocking dispensed with instructions on continued elevation and gradual return to normal shoe gear. His daughter is getting married next week and I did instruct him to be careful with  X-rays indicate the osteotomy is healing well joint congruence with no signs of pathology

## 2017-03-30 ENCOUNTER — Telehealth: Payer: Self-pay | Admitting: Neurology

## 2017-03-30 NOTE — Telephone Encounter (Signed)
I called patient. I left a message, I'll call back later. 

## 2017-03-30 NOTE — Telephone Encounter (Signed)
I called the patient, left messages on the cell phone and home phone, I will call back tomorrow.

## 2017-03-30 NOTE — Telephone Encounter (Signed)
Pt states the Pramipexole Dihydrochloride 4.5 MG TB24 is not helping with his restless leg syndrome and the medication makes him sleepy.  Pt is asking for a returned call to discuss

## 2017-03-31 MED ORDER — CARBIDOPA-LEVODOPA 25-100 MG PO TABS: ORAL_TABLET | ORAL | 2 refills | Status: DC

## 2017-03-31 MED ORDER — PRAMIPEXOLE DIHYDROCHLORIDE ER 3 MG PO TB24: 3.0000 mg | ORAL_TABLET | Freq: Every day | ORAL | 3 refills | Status: DC

## 2017-03-31 NOTE — Telephone Encounter (Signed)
I called patient. The patient is having a lot of drowsiness on the Mirapex during the day, we will cut back on the dose, he is still having a lot of restless leg symptoms.  We will increase the nighttime dose of the Sinemet taking a full 25/100 mg tablet night, we may have to increase this dosing significantly to accommodate the restless leg symptoms.  He will call for any dose adjustments.

## 2017-03-31 NOTE — Addendum Note (Signed)
Addended by: York SpanielWILLIS, CHARLES K on: 03/31/2017 07:29 AM   Modules accepted: Orders

## 2017-04-21 ENCOUNTER — Encounter: Payer: 59 | Admitting: Podiatry

## 2017-04-23 ENCOUNTER — Ambulatory Visit (INDEPENDENT_AMBULATORY_CARE_PROVIDER_SITE_OTHER): Payer: 59

## 2017-04-23 ENCOUNTER — Ambulatory Visit (INDEPENDENT_AMBULATORY_CARE_PROVIDER_SITE_OTHER): Payer: 59 | Admitting: Podiatry

## 2017-04-23 ENCOUNTER — Encounter: Payer: Self-pay | Admitting: Podiatry

## 2017-04-23 DIAGNOSIS — M2012 Hallux valgus (acquired), left foot: Secondary | ICD-10-CM

## 2017-04-23 DIAGNOSIS — M2022 Hallux rigidus, left foot: Secondary | ICD-10-CM

## 2017-04-23 DIAGNOSIS — M2021 Hallux rigidus, right foot: Secondary | ICD-10-CM

## 2017-04-28 NOTE — Progress Notes (Signed)
Subjective:    Patient ID: Stephen Best, male   DOB: 61 y.o.   MRN: [id number]   HPI patient states doing well with surgery with reduce discomfort and occasional swelling if he does too much    ROS      Objective:  Physical Exam neurovascular status intact negative Homans sign noted with patient's first MPJ left doing well with good alignment no crepitus of the joint and good structural     Assessment:    Hallux limitus condition left doing well with surgery     Plan:    H&P reviewed condition and at this point I went ahead and discussed the importance of range of motion exercises and gradual increases in activity and shoe gear choices  X-rays indicate that the bone is healing well fixation in place joint looks congruence with no spur formation

## 2017-05-20 ENCOUNTER — Other Ambulatory Visit: Payer: Self-pay | Admitting: *Deleted

## 2017-05-20 MED ORDER — CARBIDOPA-LEVODOPA 25-100 MG PO TABS: ORAL_TABLET | ORAL | 1 refills | Status: DC

## 2017-07-16 ENCOUNTER — Encounter (INDEPENDENT_AMBULATORY_CARE_PROVIDER_SITE_OTHER): Payer: Self-pay

## 2017-07-16 ENCOUNTER — Encounter: Payer: Self-pay | Admitting: Neurology

## 2017-07-16 ENCOUNTER — Ambulatory Visit: Payer: BLUE CROSS/BLUE SHIELD | Admitting: Neurology

## 2017-07-16 VITALS — BP 142/85 | HR 87 | Ht 66.5 in | Wt 239.0 lb

## 2017-07-16 DIAGNOSIS — G20 Parkinson's disease: Secondary | ICD-10-CM

## 2017-07-16 DIAGNOSIS — G2581 Restless legs syndrome: Secondary | ICD-10-CM | POA: Diagnosis not present

## 2017-07-16 MED ORDER — ROPINIROLE HCL 2 MG PO TABS: 2.0000 mg | ORAL_TABLET | Freq: Every day | ORAL | 2 refills | Status: DC

## 2017-07-16 NOTE — Progress Notes (Signed)
Reason for visit: Parkinson's disease  Stephen Best is an 62 y.o. male  History of present illness:  Stephen Best is a 62 year old right-handed white male with a history of Parkinson's disease.  The patient is on Sinemet taking the 25/100 mg tablets twice daily.  He is having ongoing issues with significant restless leg syndrome that keeps him awake frequently.  He is on extended release Mirapex 3 mg at night.  He is not getting good improvement with the restless legs that generally bothers him only at night when he tries to sleep, not during the day.  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  The patient denies any balance issues or difficulty with swallowing.  He occasionally may see something in the corner of his eye, when he looks there is nothing there.  He does not have overt hallucinations.   Past Medical History:  Diagnosis Date  . Degenerative arthritis   . HTN (hypertension)   . Movement disorder   . Obesity   . Parkinson disease (HCC) 05/02/2014  . Reflux   . Restless leg syndrome 05/02/2014  . Vocal cord granuloma     Past Surgical History:  Procedure Laterality Date  . CHOLECYSTECTOMY    . KNEE SURGERY Right    Arthroscopic  . PAROTID GLAND TUMOR EXCISION     adenoma    Family History  Problem Relation Age of Onset  . Pancreatic cancer Mother   . Heart failure Father   . Kidney disease Father   . Dementia Father   . Diabetes Father   . Hypertension Brother   . Parkinsonism Neg Hx     Social history:  reports that  has never smoked. he has never used smokeless tobacco. He reports that he drinks alcohol. He reports that he does not use drugs.   No Known Allergies  Medications:  Prior to Admission medications   Medication Sig Start Date End Date Taking? Authorizing Provider  carbidopa-levodopa (SINEMET IR) 25-100 MG tablet One half tablet twice during the day, one full tablet at night Patient taking differently: 1 tablet every 12 (twelve)  hours. One half tablet twice during the day, one full tablet at night 05/20/17  Yes York SpanielWillis, Charles K, MD  cholecalciferol (VITAMIN D) 1000 UNITS tablet Take 2,000 Units by mouth daily.   Yes [provider]  furosemide (LASIX) 20 MG tablet Take 20 mg by mouth daily.  01/14/17  Yes [provider]  losartan (COZAAR) 100 MG tablet Take 100 mg by mouth daily.  01/14/17  Yes [provider]  omeprazole (PRILOSEC) 40 MG capsule Take 40 mg by mouth daily.   Yes [provider]  rOPINIRole (REQUIP) 2 MG tablet Take 1 tablet (2 mg total) by mouth at bedtime. 07/16/17   York SpanielWillis, Charles K, MD    ROS:  Out of a complete 14 system review of symptoms, the patient complains only of the following symptoms, and all other reviewed systems are negative.  Cough Restless legs, sleep apnea, daytime sleepiness, snoring Back pain Tremors  Blood pressure (!) 142/85, pulse 87, height 5' 6.5" (1.689 m), weight 239 lb (108.4 kg).  Physical Exam  General: The patient is alert and cooperative at the time of the examination.  The patient is moderately to markedly obese.  Skin: No significant peripheral edema is noted.   Neurologic Exam  Mental status: The patient is alert and oriented x 3 at the time of the examination. The patient has apparent  normal recent and remote memory, with an apparently normal attention span and concentration ability.   Cranial nerves: Facial symmetry is present. Speech is normal, no aphasia or dysarthria is noted. Extraocular movements are full. Visual fields are full.  Motor: The patient has good strength in all 4 extremities.  Sensory examination: Soft touch sensation is symmetric on the face, arms, and legs.  Coordination: The patient has good finger-nose-finger and heel-to-shin bilaterally.  Gait and station: The patient has a normal gait.  Slight decreased arm swing is seen on the right tandem gait is normal. Romberg is negative. No drift is  seen.  Reflexes: Deep tendon reflexes are symmetric.   Assessment/Plan:  1.  Parkinson's disease  2.  Restless leg syndrome  The patient will be taken off of Mirapex and placed on Requip 2 mg at night, he will call for any dose adjustments.  The patient may need to increase the Sinemet at night as well for the restless legs.  He has minimal signs of Parkinson's disease.  He will follow-up in 6 months.  Marlan Palau MD 07/16/2017 10:28 AM  [facility] [address] [place], [place] [address]  Phone [phone] Fax [phone]

## 2017-07-21 ENCOUNTER — Telehealth: Payer: Self-pay | Admitting: Neurology

## 2017-07-21 NOTE — Telephone Encounter (Signed)
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 He has not been able to take the new medication for restless leg and everytime he sits or lies down his legs won't stop moving.  Pt said he's been without sleep for 36 hrs due to the restless leg. Please call to advise asap.

## 2017-07-21 NOTE — Telephone Encounter (Signed)
I called the patient.  The patient is having increased symptoms with restless legs.  He will increase the Requip to 4 mg at night, if this is not helpful we will increase his Sinemet dose as well.

## 2017-08-24 ENCOUNTER — Telehealth: Payer: Self-pay | Admitting: Neurology

## 2017-08-24 MED ORDER — PRAMIPEXOLE DIHYDROCHLORIDE ER 4.5 MG PO TB24: 4.5000 mg | ORAL_TABLET | Freq: Every day | ORAL | 1 refills | Status: DC

## 2017-08-24 NOTE — Telephone Encounter (Signed)
Called/spoke w/ patient. He states he tried increasing the requip to 4mg  at night as instructed by Dr. Anne HahnWillis on 07/21/17, this was ineffective. He states about 10 days ago "his legs flew out of bed" and he fell out of bed. The next day he stopped the requip and re-started the mirapex 4.5mg  tablet. He states it makes him drowsy but he would rather tolerate this than being drowsy from getting no sleep. He states mirapex working better than requip but does not completely help with symptoms.   Advised if Dr. Anne HahnWillis agrees with sending refill, we will send to pharmacy. If anything further needed we will call. He verbalized understanding.  If Dr. Anne HahnWillis wants to contact him, he advised to call on his cell at [phone].

## 2017-08-24 NOTE — Telephone Encounter (Signed)
A prescription was sent in for the extended release Mirapex, 4.5 mg tablet take 1 at night.

## 2017-08-24 NOTE — Addendum Note (Signed)
Addended by: York SpanielWILLIS, CHARLES K on: 08/24/2017 05:11 PM   Modules accepted: Orders

## 2017-08-24 NOTE — Telephone Encounter (Signed)
Pt states he has gone back on PRAMIPEXOLE And it is working better than the last medication he was put on.  Pt would like to know if he could get a refill of PRAMIPEXOLE  and if so to please send to  [facility] [place] [place] - [address] [phone] (Phone) [phone] (Fax)   Pt would like a call if this can not be done

## 2017-10-06 ENCOUNTER — Other Ambulatory Visit: Payer: Self-pay | Admitting: Neurology

## 2017-10-30 ENCOUNTER — Other Ambulatory Visit: Payer: Self-pay | Admitting: Neurology

## 2018-01-17 ENCOUNTER — Other Ambulatory Visit: Payer: Self-pay | Admitting: Neurology

## 2018-01-25 ENCOUNTER — Ambulatory Visit: Payer: BLUE CROSS/BLUE SHIELD | Admitting: Neurology

## 2018-01-25 ENCOUNTER — Encounter: Payer: Self-pay | Admitting: Neurology

## 2018-01-25 VITALS — BP 120/81 | HR 84 | Ht 66.5 in | Wt 233.0 lb

## 2018-01-25 DIAGNOSIS — G2581 Restless legs syndrome: Secondary | ICD-10-CM

## 2018-01-25 DIAGNOSIS — G20 Parkinson's disease: Secondary | ICD-10-CM | POA: Diagnosis not present

## 2018-01-25 DIAGNOSIS — G4733 Obstructive sleep apnea (adult) (pediatric): Secondary | ICD-10-CM

## 2018-01-25 MED ORDER — CARBIDOPA-LEVODOPA 25-100 MG PO TABS: 1.0000 | ORAL_TABLET | Freq: Three times a day (TID) | ORAL | 2 refills | Status: DC

## 2018-01-25 NOTE — Progress Notes (Signed)
Reason for visit: Parkinson's disease  Stephen Best is an 62 y.o. male  History of present illness:  Stephen Best is a 62 year old right-handed white male with a history of Parkinson's disease and restless leg syndrome.  In the past, he has had a sleep evaluation that showed evidence of severe sleep apnea, he never went on CPAP as he was concerned that the findings on the sleep study were skewed by the fact that he had a bad cold at the time of the study.  The patient however has had ongoing problems with fatigue, he has reported some troubles with memory.  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  He has had some word finding problems.  At times, when he is fatigued he notes a decrease in voice amplitude.  He has good balance, he has not had any falls, he tries to stay active, he rides a bike on a regular basis.  The patient has had a prior fracture of the nose and in case he has difficulty breathing through his nose, he likes to sleep on his stomach.  The patient has noted some tremor now affecting the left hand as well as the right.  He returns for an evaluation.  He has had ongoing problems with restless leg syndrome, he is on Mirapex, and low-dose Sinemet.  Past Medical History:  Diagnosis Date  . Degenerative arthritis   . HTN (hypertension)   . Movement disorder   . Obesity   . Parkinson disease (HCC) 05/02/2014  . Reflux   . Restless leg syndrome 05/02/2014  . Vocal cord granuloma     Past Surgical History:  Procedure Laterality Date  . CHOLECYSTECTOMY    . KNEE SURGERY Right    Arthroscopic  . PAROTID GLAND TUMOR EXCISION     adenoma    Family History  Problem Relation Age of Onset  . Pancreatic cancer Mother   . Heart failure Father   . Kidney disease Father   . Dementia Father   . Diabetes Father   . Hypertension Brother   . Parkinsonism Neg Hx     Social history:  reports that he has never smoked. He has never used smokeless tobacco. He reports that he drinks  alcohol. He reports that he does not use drugs.   No Known Allergies  Medications:  Prior to Admission medications   Medication Sig Start Date End Date Taking? Authorizing Provider  carbidopa-levodopa (SINEMET IR) 25-100 MG tablet Take 1 tablet by mouth 3 (three) times daily. 01/25/18  Yes York SpanielWillis, Charles K, MD  cholecalciferol (VITAMIN D) 1000 UNITS tablet Take 2,000 Units by mouth daily.   Yes [provider]  furosemide (LASIX) 20 MG tablet Take 20 mg by mouth daily.  01/14/17  Yes [provider]  losartan (COZAAR) 100 MG tablet Take 100 mg by mouth daily.  01/14/17  Yes [provider]  omeprazole (PRILOSEC) 40 MG capsule Take 40 mg by mouth daily.   Yes [provider]  Pramipexole Dihydrochloride (MIRAPEX ER) 4.5 MG TB24 Take 1 tablet (4.5 mg total) by mouth at bedtime. 08/24/17  Yes York SpanielWillis, Charles K, MD    ROS:  Out of a complete 14 system review of symptoms, the patient complains only of the following symptoms, and all other reviewed systems are negative.  Restless legs, insomnia, sleep apnea, daytime sleepiness, snoring Urinary urgency Back pain Speech difficulty, tremors  Blood pressure 120/81, pulse 84, height 5' 6.5" (1.689 m), weight 233 lb (105.7  kg), SpO2 96 %.  Physical Exam  General: The patient is alert and cooperative at the time of the examination.  The patient is moderately obese.  Skin: No significant peripheral edema is noted.   Neurologic Exam  Mental status: The patient is alert and oriented x 3 at the time of the examination. The patient has apparent normal recent and remote memory, with an apparently normal attention span and concentration ability.   Cranial nerves: Facial symmetry is present. Speech is normal, no aphasia or dysarthria is noted. Extraocular movements are full. Visual fields are full.  Very mild masking of the face is seen.  Motor: The patient has good strength in all 4 extremities.  Sensory  examination: Soft touch sensation is symmetric on the face, arms, and legs.  Coordination: The patient has good finger-nose-finger and heel-to-shin bilaterally.  Minimal right upper extremity tremor is noted.  Gait and station: The patient has a normal gait. Tandem gait is normal. Romberg is negative. No drift is seen.  Reflexes: Deep tendon reflexes are symmetric.   Assessment/Plan:  1.  Parkinson's disease  2.  Restless leg syndrome  3.  Sleep apnea  The patient will be referred for another sleep evaluation, he is reporting some memory issues that may be in part related to this.  The patient will be increased on the Sinemet taking the 25/100 mg tablets, 1 tablet 3 times daily.  Hopefully this will help his restless leg syndrome as well as the Parkinson symptoms.  He will remain on the current dose of Mirapex.  He will follow-up in 6 months.  Marlan Palau. Keith Willis MD 01/25/2018 10:09 AM  [facility] [address] [place], [place] [id number]  Phone [phone] Fax [phone]

## 2018-02-16 ENCOUNTER — Other Ambulatory Visit: Payer: Self-pay | Admitting: Neurology

## 2018-03-07 DIAGNOSIS — Z23 Encounter for immunization: Secondary | ICD-10-CM | POA: Diagnosis not present

## 2018-04-11 ENCOUNTER — Encounter: Payer: Self-pay | Admitting: Neurology

## 2018-04-11 ENCOUNTER — Ambulatory Visit: Payer: BLUE CROSS/BLUE SHIELD | Admitting: Neurology

## 2018-04-11 VITALS — BP 142/81 | HR 76 | Ht 66.5 in | Wt 236.0 lb

## 2018-04-11 DIAGNOSIS — G2581 Restless legs syndrome: Secondary | ICD-10-CM

## 2018-04-11 DIAGNOSIS — G4733 Obstructive sleep apnea (adult) (pediatric): Secondary | ICD-10-CM

## 2018-04-11 DIAGNOSIS — E669 Obesity, unspecified: Secondary | ICD-10-CM | POA: Diagnosis not present

## 2018-04-11 DIAGNOSIS — G20 Parkinson's disease: Secondary | ICD-10-CM

## 2018-04-11 NOTE — Progress Notes (Signed)
Subjective:    Patient ID: Stephen Best is a 62 y.o. male.  HPI    Interim history:   Stephen Best is a 89 year old right-handed gentleman with an underlying medical history of parkinsonism, restless leg syndrome, hypertension, reflux disease, and obesity, who presents for evaluation of his obstructive sleep apnea. The patient is unaccompanied today. I first met him on 04/08/2016 at the request of Dr. Jannifer Franklin, at which time the patient reported snoring and daytime somnolence. He was advised to proceed with a sleep study. He had a split-night sleep study on 05/29/2016. Baseline portion of the study showed a sleep efficiency of 82.2%, sleep latency was 11 minutes, REM latency was 96.5 minutes which is normal. He had a total AHI of 33.3 per hour, REM AHI was 86.7 per hour, average oxygen saturation of 93%, nadir was 81%. He had severe PLMS. He was titrated via nasal mask with CPAP of 5 cm to up to 7 cm. On the final pressure his AHI was 0 per hour, O2 nadir of 94% with non-REM sleep and nonsupine sleep achieved. Based on his test results I advise patient to proceed with CPAP therapy at home. He was noted to have PLM's also during the second part of the study. The patient did not pursue CPAP therapy at the time. He also declined a follow-up appointment at the time.  Today, 04/11/2018: He reports that he typically sleeps on his stomach and was not able to do that as well as at home at the time of his prior sleep study. Of note, at the time of his sleep study he had mostly nonsupine sleep. His Epworth sleepiness score is 8 out of 24, fatigue score is 26 out of 63. He drinks alcohol occasionally, currently drinks caffeine about 2 cups per day. He goes to bed late, he does work on weekends. He does not keep a very set schedule for sleep. He would be willing to try CPAP. He has ongoing issues with restless leg syndrome. He has had symptoms for years.   The patient's allergies, current medications, family history,  past medical history, past social history, past surgical history and problem list were reviewed and updated as appropriate.   Previously:   04/08/2016: (He) reports snoring and excessive daytime somnolence. I reviewed your office note from 01/09/2016. He was recently advised to switch from immediate release pramipexole to long acting. Before he was diagnosed with RLS he had he had issues with sleep onset insomnia. He works as a Animal nutritionist, Medical laboratory scientific officer. He is divorced, has one son, one daughter, lives alone, has a GF.  Non smoker, drinks alcohol 2-3 times per week. Coffee in AM, no sodas.  His Epworth sleepiness score is 15 out of 24 today, his fatigue score is 38 out of 63. He has a tendency to move his legs and twitches in his sleep. He denies any dream enactments. He has been told he snores. He denies morning headaches or nocturia. He denies a family history of Parkinson's disease or obstructive sleep apnea.  His Past Medical History Is Significant For: Past Medical History:  Diagnosis Date  . Degenerative arthritis   . HTN (hypertension)   . Movement disorder   . Obesity   . Parkinson disease ([place]) 05/02/2014  . Reflux   . Restless leg syndrome 05/02/2014  . Vocal cord granuloma     His Past Surgical History Is Significant For: Past Surgical History:  Procedure Laterality Date  . CHOLECYSTECTOMY    . KNEE SURGERY Right  Arthroscopic  . PAROTID GLAND TUMOR EXCISION     adenoma    His Family History Is Significant For: Family History  Problem Relation Age of Onset  . Pancreatic cancer Mother   . Heart failure Father   . Kidney disease Father   . Dementia Father   . Diabetes Father   . Hypertension Brother   . Parkinsonism Neg Hx     His Social History Is Significant For: Social History   Socioeconomic History  . Marital status: Divorced    Spouse name: Not on file  . Number of children: 2  . Years of education: Post Grad  . Highest education level: Not on file   Occupational History  . Occupation: Semi-retired per patient  Social Needs  . Financial resource strain: Not on file  . Food insecurity:    Worry: Not on file    Inability: Not on file  . Transportation needs:    Medical: Not on file    Non-medical: Not on file  Tobacco Use  . Smoking status: Never Smoker  . Smokeless tobacco: Never Used  Substance and Sexual Activity  . Alcohol use: Yes  . Drug use: No  . Sexual activity: Not on file  Lifestyle  . Physical activity:    Days per week: Not on file    Minutes per session: Not on file  . Stress: Not on file  Relationships  . Social connections:    Talks on phone: Not on file    Gets together: Not on file    Attends religious service: Not on file    Active member of club or organization: Not on file    Attends meetings of clubs or organizations: Not on file    Relationship status: Not on file  Other Topics Concern  . Not on file  Social History Narrative   Drinks 2 caffeine drinks in the morning     His Allergies Are:  No Known Allergies:   His Current Medications Are:  Outpatient Encounter Medications as of 04/11/2018  Medication Sig  . carbidopa-levodopa (SINEMET IR) 25-100 MG tablet Take 1 tablet by mouth 3 (three) times daily.  . cholecalciferol (VITAMIN D) 1000 UNITS tablet Take 2,000 Units by mouth daily.  . furosemide (LASIX) 20 MG tablet Take 20 mg by mouth daily.   Marland Kitchen losartan (COZAAR) 100 MG tablet Take 100 mg by mouth daily.   Marland Kitchen omeprazole (PRILOSEC) 40 MG capsule Take 40 mg by mouth daily.  . Pramipexole Dihydrochloride 4.5 MG TB24 TAKE 1 TABLET (4.5 MG TOTAL) BY MOUTH AT BEDTIME.   No facility-administered encounter medications on file as of 04/11/2018.   :  Review of Systems:  Out of a complete 14 point review of systems, all are reviewed and negative with the exception of these symptoms as listed below: Review of Systems  Neurological:       Pt presents today to discuss his sleep. Pt reports that he  sleeps on his stomach so if another sleep study is warranted he would like to sleep prone.   Epworth Sleepiness Scale 0= would never doze 1= slight chance of dozing 2= moderate chance of dozing 3= high chance of dozing  Sitting and reading: 1 Watching TV: 1 Sitting inactive in a public place (ex. Theater or meeting): 2 As a passenger in a car for an hour without a break: 1 Lying down to rest in the afternoon: 1 Sitting and talking to someone: 0 Sitting quietly after lunch (no  alcohol): 2 In a car, while stopped in traffic: 0 Total: 8     Objective:  Neurological Exam  Physical Exam Physical Examination:   Vitals:   04/11/18 1402  BP: (!) 142/81  Pulse: 76    General Examination: The patient is a very pleasant 62 y.o. male in no acute distress. He appears well-developed and well-nourished and well groomed.   HEENT: Normocephalic, atraumatic, pupils are equal, round and reactive to light and accommodation. Extraocular tracking is good without limitation to gaze excursion or nystagmus noted. Fairly normal smooth pursuit is noted. Hearing is grossly intact. Face is symmetric with mild facial masking noted and decrease in eye blink rate. Speech is clear with mild hypophonia noted. There is no lip, neck/head, jaw or voice tremor. Neck is very mildly rigid with full range of passive and active motion. Oropharynx exam reveals: moderate mouth dryness, adequate dental hygiene and moderate airway crowding, due to smaller airway entry and tonsils in place, uvula is wider and larger. Mallampati is class II. Tongue protrudes centrally and palate elevates symmetrically. Tonsils are not fully visulized. Nasal inspection reveals no significant nasal mucosal bogginess or redness and no septal deviation, but smaller nasal passages.   Chest: Clear to auscultation without wheezing, rhonchi or crackles noted.  Heart: S1+S2+0, regular and normal without murmurs, rubs or gallops noted.   Abdomen:  Soft, non-tender and non-distended with normal bowel sounds appreciated on auscultation.  Extremities: There is 2+ pitting edema in the distal lower extremities bilaterally.   Skin: Warm and dry without trophic changes noted.   Musculoskeletal: exam reveals no obvious joint deformities, tenderness or joint swelling or erythema.   Neurologically:  Mental status: The patient is awake, alert and oriented in all 4 spheres. His immediate and remote memory, attention, language skills and fund of knowledge are appropriate. There is no evidence of aphasia, agnosia, apraxia or anomia. Thought process is linear. Mood is normal and affect is normal.  Cranial nerves II - XII are as described above under HEENT exam. Motor exam: Normal bulk, and strength are noted. There is no tremor. Reflexes are 1+ throughout. Fine motor skills and coordination: He has mild impairment of fine motor skills on the right, minimally so on the left.  Cerebellar testing: No dysmetria or intention tremor. There is no truncal or gait ataxia.  Sensory exam: intact to light touch in the upper and lower extremities.  Gait, station and balance: He stands easily. Mildly stooped posture for age. He walks with good pace and stride length, decreased arm swing on the right. He turns fairly quickly, no significant balance impairment noted.   Assessment and Plan:   In summary, Bayler Gehrig is a very pleasant 62 year old male with an underlying medical history of parkinsonism, restless leg syndrome, hypertension, reflux disease, and obesity, who presents for reevaluation of his obstructive sleep apnea. He had sleep study testing in December 2017 which indicated severe sleep apnea but he did have significant sleep disruption. He did quite well on CPAP of 7 cm. He chose not to pursue a sleep apnea treatment at the time. He would be willing to try CPAP at this time. He is advised that sleep apnea treatment may improve his sleep quality, daytime  somnolence, even his restless leg symptoms. To that end, I suggested we proceed with CPAP therapy at a pressure of 7 cm. He may be able to try a nasal interface such as nasal pillows or nasal cushion. He is worried about not being  able to sleep on his stomach. He is advised to discuss mask options with his DME company. He is advised to follow-up routinely in about 3 months, sooner if needed. I answered all his questions today and he was in agreement.  I spent 25 minutes in total face-to-face time with the patient, more than 50% of which was spent in counseling and coordination of care, reviewing test results, reviewing medication and discussing or reviewing the diagnosis of OSA, its prognosis and treatment options. Pertinent laboratory and imaging test results that were available during this visit with the patient were reviewed by me and considered in my medical decision making (see chart for details).

## 2018-04-11 NOTE — Progress Notes (Signed)
Order for new cpap sent to [employer] via community message via [employer]. Confirmation received that the order transmitted was successful.

## 2018-04-11 NOTE — Patient Instructions (Signed)
I would like for you to start CPAP therapy at home. I have placed the order in the chart. You will need as follow up appointment in about 10 weeks post set up, that has to be scheduled; please go ahead and schedule with one of our nurse practitioners.   We have arranged for your CPAP set up at home through [employer], a local DME company.   Please use your CPAP regularly. While your insurance requires that you use CPAP at least 4 hours each night on 70% of the nights, I recommend, that you not skip any nights and use it throughout the night if you can, even for scheduled naps. Getting used to CPAP and staying with the treatment long term does take time and patience and discipline. Untreated obstructive sleep apnea when it is moderate to severe can have an adverse impact on cardiovascular health and raise her risk for heart disease, arrhythmias, hypertension, congestive heart failure, stroke and diabetes. Untreated obstructive sleep apnea causes sleep disruption, nonrestorative sleep, and sleep deprivation. This can have an impact on your day to day functioning and cause daytime sleepiness and impairment of cognitive function, memory loss, mood disturbance, and problems focussing. Using CPAP regularly can improve these symptoms.

## 2018-04-12 ENCOUNTER — Telehealth: Payer: Self-pay

## 2018-04-12 NOTE — Telephone Encounter (Signed)
Received notice from [facility] that [facility] will not accept his sleep study from 2017 to start him on cpap. He will need a sleep study performed within the past year. I called pt to discuss. No answer, left a message asking him to call me back.

## 2018-04-13 NOTE — Telephone Encounter (Signed)
I called pt to discuss. No answer, left a message asking him to call me back. 

## 2018-04-18 NOTE — Addendum Note (Signed)
Addended by: Geronimo Running A on: 04/18/2018 03:04 PM   Modules accepted: Orders

## 2018-04-18 NOTE — Telephone Encounter (Signed)
Pt returning RNs call, aware of what was noted. Requesting a call to schedule to another sleep study

## 2018-04-18 NOTE — Telephone Encounter (Signed)
I called pt again, no answer, left a message asking him to call me back. This is my third unsuccessful attempt at reaching him by phone. I will send pt a letter asking him to call me back.

## 2018-04-18 NOTE — Telephone Encounter (Signed)
Received VO from Dr. Frances Furbish regarding split night. Order placed.

## 2018-04-29 DIAGNOSIS — J019 Acute sinusitis, unspecified: Secondary | ICD-10-CM | POA: Diagnosis not present

## 2018-04-29 DIAGNOSIS — H109 Unspecified conjunctivitis: Secondary | ICD-10-CM | POA: Diagnosis not present

## 2018-05-10 ENCOUNTER — Telehealth: Payer: Self-pay | Admitting: Neurology

## 2018-05-10 NOTE — Telephone Encounter (Signed)
Spoke with pt. who sts. he will be changing ins. co. at the end of 2019, and his new ins. will not cover Pramipexole 4.5mg , but will cover 1.5mg  tablets. I have explained that it depends on why ins. will not cover the Pramipexole--it may be that a simple PA is required.  Since he reports doing well on his current dose, we can try and get this approved with his new ins. co. (once that policy is in effect).  We don't want him to change a medication he is stable on unless there is not another option. He verbalized understanding of same and is agreeable with this plan/fim

## 2018-05-10 NOTE — Telephone Encounter (Signed)
Pt requesting a call stating he has a few question regarding Pramipexole Dihydrochloride 4.5 MG TB24

## 2018-05-16 ENCOUNTER — Ambulatory Visit
Admission: RE | Admit: 2018-05-16 | Discharge: 2018-05-16 | Disposition: A | Discharge status: Home / Self Care | Payer: BLUE CROSS/BLUE SHIELD | Source: Ambulatory Visit | Attending: Internal Medicine | Admitting: Internal Medicine

## 2018-05-16 ENCOUNTER — Other Ambulatory Visit: Payer: Self-pay | Admitting: Internal Medicine

## 2018-05-16 DIAGNOSIS — M5136 Other intervertebral disc degeneration, lumbar region: Secondary | ICD-10-CM | POA: Diagnosis not present

## 2018-05-16 DIAGNOSIS — G4733 Obstructive sleep apnea (adult) (pediatric): Secondary | ICD-10-CM | POA: Diagnosis not present

## 2018-05-16 DIAGNOSIS — M545 Low back pain, unspecified: Secondary | ICD-10-CM

## 2018-05-16 DIAGNOSIS — I10 Essential (primary) hypertension: Secondary | ICD-10-CM | POA: Diagnosis not present

## 2018-05-16 DIAGNOSIS — R600 Localized edema: Secondary | ICD-10-CM | POA: Diagnosis not present

## 2018-05-16 DIAGNOSIS — Z125 Encounter for screening for malignant neoplasm of prostate: Secondary | ICD-10-CM | POA: Diagnosis not present

## 2018-05-19 ENCOUNTER — Ambulatory Visit (INDEPENDENT_AMBULATORY_CARE_PROVIDER_SITE_OTHER): Payer: BLUE CROSS/BLUE SHIELD | Admitting: Neurology

## 2018-05-19 DIAGNOSIS — G4733 Obstructive sleep apnea (adult) (pediatric): Secondary | ICD-10-CM | POA: Diagnosis not present

## 2018-05-19 DIAGNOSIS — G20 Parkinson's disease: Secondary | ICD-10-CM

## 2018-05-19 DIAGNOSIS — G4720 Circadian rhythm sleep disorder, unspecified type: Secondary | ICD-10-CM

## 2018-05-19 DIAGNOSIS — E669 Obesity, unspecified: Secondary | ICD-10-CM

## 2018-05-19 DIAGNOSIS — G2581 Restless legs syndrome: Secondary | ICD-10-CM

## 2018-05-19 DIAGNOSIS — G4761 Periodic limb movement disorder: Secondary | ICD-10-CM

## 2018-05-23 ENCOUNTER — Telehealth: Payer: Self-pay

## 2018-05-23 NOTE — Progress Notes (Signed)
Patient referred by Dr. Anne HahnWillis, seen by me on 04/11/18 for re-eval for OSA, split night sleep study on 05/19/18. Please call and notify patient that the recent sleep study confirmed the diagnosis of moderate to severe OSA. He did well with CPAP during the study with significant improvement of the respiratory events. Therefore, I would like start the patient on CPAP therapy at home by prescribing a machine for home use. I placed the order in the chart.  Please advise patient that we need a follow up appointment with either myself or one of our nurse practitioners in about 10 weeks post set-up to check for how the patient is feeling and how well the patient is using the machine, etc. Please go ahead and schedule the appointment, while you have the patient on the phone and make sure patient understands the importance of keeping this window for the FU appointment, as it is often an insurance requirement. Failing to adhere to this may result in losing coverage for sleep apnea treatment, at which point most patients are left with a choice of returning the machine or paying out of pocket (and we want neither of this to happen!).  Please re-enforce the importance of compliance with treatment and the need for us to monitor compliance data - again an insurance requirement and usually a good feedback for the patient as far as how they are doing.  Also remind patient, that any PAP machine or mask issues should be first addressed with the DME company, who provided the machine/mask.  Please ask if patient has a preference regarding DME company, may depend on the insurance too.  Please arrange for CPAP set up at home through a DME company of patient's choice.  Once you have spoken to the patient you can close the phone encounter. Please fax/route report to referring provider, thanks,   Huston FoleySaima Shanin Szymanowski, MD, PhD [facility])

## 2018-05-23 NOTE — Telephone Encounter (Signed)
-----   Message from Huston FoleySaima Athar, MD sent at 05/23/2018  8:16 AM EST ----- Patient referred by Dr. Anne HahnWillis, seen by me on 04/11/18 for re-eval for OSA, split night sleep study on 05/19/18. Please call and notify patient that the recent sleep study confirmed the diagnosis of moderate to severe OSA. He did well with CPAP during the study with significant improvement of the respiratory events. Therefore, I would like start the patient on CPAP therapy at home by prescribing a machine for home use. I placed the order in the chart.  Please advise patient that we need a follow up appointment with either myself or one of our nurse practitioners in about 10 weeks post set-up to check for how the patient is feeling and how well the patient is using the machine, etc. Please go ahead and schedule the appointment, while you have the patient on the phone and make sure patient understands the importance of keeping this window for the FU appointment, as it is often an insurance requirement. Failing to adhere to this may result in losing coverage for sleep apnea treatment, at which point most patients are left with a choice of returning the machine or paying out of pocket (and we want neither of this to happen!).  Please re-enforce the importance of compliance with treatment and the need for us to monitor compliance data - again an insurance requirement and usually a good feedback for the patient as far as how they are doing.  Also remind patient, that any PAP machine or mask issues should be first addressed with the DME company, who provided the machine/mask.  Please ask if patient has a preference regarding DME company, may depend on the insurance too.  Please arrange for CPAP set up at home through a DME company of patient's choice.  Once you have spoken to the patient you can close the phone encounter. Please fax/route report to referring provider, thanks,   Huston FoleySaima Athar, MD, PhD [facility])

## 2018-05-23 NOTE — Telephone Encounter (Signed)
I called pt. I advised pt that Dr. Frances FurbishAthar reviewed their sleep study results and found that pt has moderate to severe osa. Dr. Frances FurbishAthar recommends that pt start a cpap at home. I reviewed PAP compliance expectations with the pt. Pt is agreeable to starting a CPAP. I advised pt that an order will be sent to a DME, [facility], and [facility] will call the pt within about one week after they file with the pt's insurance. [facility] will show the pt how to use the machine, fit for masks, and troubleshoot the CPAP if needed. A follow up appt was made for insurance purposes with Eber Jonesarolyn, NP on 08/24/18 at 11:15am. Pt verbalized understanding to arrive 15 minutes early and bring their CPAP. A letter with all of this information in it will be mailed to the pt as a reminder. I verified with the pt that the address we have on file is correct. Pt verbalized understanding of results. Pt had no questions at this time but was encouraged to call back if questions arise. I [facility] order to [facility] and have received confirmation that they have received the order.

## 2018-05-23 NOTE — Procedures (Signed)
PATIENT'S NAME:  Stephen Best, Stephen Best DOB:      Jul 29, 1955      MR#:    [id number]     DATE OF RECORDING: 05/19/2018 REFERRING M.D.:  Beverley FiedlerVictoria Rankins, MD Study Performed:  Split-Night Titration Study HISTORY: 62 year old man with a history of parkinsonism, restless leg syndrome, hypertension, reflux disease, and obesity, who presents for re-evaluation of his obstructive sleep apnea. The patient endorsed the Epworth Sleepiness Scale at 8/24 points. The patient's weight 236 pounds with a height of 66 (inches), resulting in a BMI of 37.9 kg/m2. The patient's neck circumference measured 16 inches.  CURRENT MEDICATIONS: Sinemet, Vitamin D, Prilosec, Pramipexole  Dihydrochloride, Cozaar.  PROCEDURE:  This is a multichannel digital polysomnogram utilizing the Somnostar 11.2 system.  Electrodes and sensors were applied and monitored per AASM Specifications.   EEG, EOG, Chin and Limb EMG, were sampled at 200 Hz.  ECG, Snore and Nasal Pressure, Thermal Airflow, Respiratory Effort, CPAP Flow and Pressure, Oximetry was sampled at 50 Hz. Digital video and audio were recorded.      BASELINE STUDY WITHOUT CPAP RESULTS:  Lights Out was at 22:18 and Lights On at 05:13.  Total recording time (TRT) was 156.5, with a total sleep time (TST) of 138.5 minutes.  The patient's sleep latency was 1.5 minutes. REM latency was 108.5 minutes. The sleep efficiency was 88.5%.    SLEEP ARCHITECTURE: WASO (Wake after sleep onset) was 12 minutes, Stage N1 was 6 minutes, Stage N2 was 104 minutes, Stage N3 was 1.5 minutes and Stage R (REM sleep) was 27 minutes. The percentages were Stage N1 4.3%, Stage N2 75.1%, Stage N3 1.1% and Stage R (REM sleep) 19.5%. The arousals were noted as: 12 were spontaneous, 12 were associated with PLMs, 7 were associated with respiratory events.  RESPIRATORY ANALYSIS:  There were a total of 60 respiratory events:  47 obstructive apneas, 5 central apneas and 0 mixed apneas with a total of 52 apneas and an apnea  index (AI) of 22.5. There were 8 hypopneas with a hypopnea index of 3.5. The patient also had 0 respiratory event related arousals (RERAs).  Snoring was noted.     The total APNEA/HYPOPNEA INDEX (AHI) was 26./hour and the total RESPIRATORY DISTURBANCE INDEX was 26. /hour.  39 events occurred in REM sleep and 18 events in NREM. The REM AHI was 86.7, /hour versus a non-REM AHI of 11.3 /hour. The patient spent 163 minutes sleep time in the supine position 215 minutes in non-supine. The supine AHI was 0.0 /hour versus a non-supine AHI of 26.0 /hour.  OXYGEN SATURATION & C02:  The wake baseline 02 saturation was 94%, with the lowest being 84%. Time spent below 89% saturation equaled 4 minutes.   PERIODIC LIMB MOVEMENTS: The patient had a total of 133 Periodic Limb Movements. The Periodic Limb Movement (PLM) index was 57.6 /hour and the PLM Arousal index was 5.2 /hour.   Audio and video analysis did not show any abnormal or unusual movements, behaviors, phonations or vocalizations. The patient took bathroom breaks. Snoring was noted. The EKG was in keeping with normal sinus rhythm (NSR).   TITRATION STUDY WITH CPAP RESULTS:   The patient was fitted with a medium Simplus FFM, as he had perpetual mouth venting. CPAP was initiated at 5 cmH20 with heated humidity per AASM split night standards and pressure was advanced to 12 cmH20 because of hypopneas, apneas and desaturations.  At a PAP pressure of 12 cmH20, there was a reduction of the AHI to  0 /hour with non-supine NREM sleep achieved and O2 nadir of 94%.    Total recording time (TRT) was 258.5 minutes, with a total sleep time (TST) of 239 minutes. The patient's sleep latency was 6.5 minutes. REM latency was 45 minutes.  The sleep efficiency was 92.5%.    SLEEP ARCHITECTURE: Wake after sleep was 12 minutes, Stage N1 6 minutes, Stage N2 164.5 minutes, Stage N3 19 minutes and Stage R (REM sleep) 49.5 minutes. The percentages were: Stage N1 2.5%, Stage N2  68.8%, Stage N3 7.9% and Stage R (REM sleep) 20.7%. The arousals were noted as: 13 were spontaneous, 0 were associated with PLMs, 1 were associated with respiratory events.  RESPIRATORY ANALYSIS:  There were a total of 41 respiratory events: 0 obstructive apneas, 31 central apneas and 0 mixed apneas with a total of 31 apneas and an apnea index (AI) of 7.8. There were 10 hypopneas with a hypopnea index of 2.5 /hour. The patient also had 0 respiratory event related arousals (RERAs).      The total APNEA/HYPOPNEA INDEX  (AHI) was 10.3 /hour and the total RESPIRATORY DISTURBANCE INDEX was 10.3 /hour.  9 events occurred in REM sleep and 32 events in NREM. The REM AHI was 10.9 /hour versus a non-REM AHI of 10.1 /hour. REM sleep was achieved on a pressure of  cm/h2o (AHI was  .) The patient spent 68% of total sleep time in the supine position. The supine AHI was 15.1 /hour, versus a non-supine AHI of 0.0/hour.  OXYGEN SATURATION & C02:  The wake baseline 02 saturation was 90%, with the lowest being 87%. Time spent below 89% saturation equaled 1 minutes.  PERIODIC LIMB MOVEMENTS: The patient had a total of 0 Periodic Limb Movements. The Periodic Limb Movement (PLM) index was 0 /hour and the PLM Arousal index was 0 /hour.  Post-study, the patient indicated that sleep was worse than usual.  POLYSOMNOGRAPHY IMPRESSION :   1. Obstructive Sleep Apnea (OSA)  2. Dysfunctions associated with sleep stages or arousals from sleep 3. Periodic Limb Movement Disorder (PLMD)  RECOMMENDATIONS:  1. This patient has moderate to severe obstructive sleep apnea and responded well on CPAP therapy. I will, therefore, start the patient on home CPAP treatment at a pressure of 12 cm via medium FFM, with heated humidity. The patient should be reminded to be fully compliant with PAP therapy to improve sleep related symptoms and decrease long term cardiovascular risks. Please note that untreated obstructive sleep apnea may carry  additional perioperative morbidity. Patients with significant obstructive sleep apnea should receive perioperative PAP therapy and the surgeons and particularly the anesthesiologist should be informed of the diagnosis and the severity of the sleep disordered breathing. 2. Severe PLMs (periodic limb movements of sleep) were noted during the baseline portin of the study with minimal arousals; clinical correlation is recommended.  3. This study shows sleep fragmentation and abnormal sleep stage percentages; these are nonspecific findings and per se do not signify an intrinsic sleep disorder or a cause for the patient's sleep-related symptoms. Causes include (but are not limited to) the first night effect of the sleep study, circadian rhythm disturbances, medication effect or an underlying mood disorder or medical problem.  4. The patient should be cautioned not to drive, work at heights, or operate dangerous or heavy equipment when tired or sleepy. Review and reiteration of good sleep hygiene measures should be pursued with any patient. 5. The patient will be seen in follow-up in the sleep clinic at  [facility] for discussion of the test results, symptom and treatment compliance review, further management strategies, etc. The referring provider will be notified of the test results.  I certify that I have reviewed the entire raw data recording prior to the issuance of this report in accordance with the Standards of Accreditation of the American Academy of Sleep Medicine (AASM)   Huston Foley, MD, PhD Diplomat, American Board of Neurology and Sleep Medicine (Neurology and Sleep Medicine)

## 2018-05-23 NOTE — Addendum Note (Signed)
Addended by: Huston FoleyATHAR, Marvell Tamer on: 05/23/2018 08:16 AM   Modules accepted: Orders

## 2018-06-13 DIAGNOSIS — G4733 Obstructive sleep apnea (adult) (pediatric): Secondary | ICD-10-CM | POA: Diagnosis not present

## 2018-07-13 ENCOUNTER — Ambulatory Visit: Payer: BLUE CROSS/BLUE SHIELD | Admitting: Neurology

## 2018-07-14 DIAGNOSIS — G4733 Obstructive sleep apnea (adult) (pediatric): Secondary | ICD-10-CM | POA: Diagnosis not present

## 2018-07-18 DIAGNOSIS — M545 Low back pain: Secondary | ICD-10-CM | POA: Diagnosis not present

## 2018-07-28 ENCOUNTER — Telehealth: Payer: Self-pay

## 2018-07-28 NOTE — Telephone Encounter (Signed)
I contacted the pt and left vm advising our office would be closing today (2/20) at 12 pm and would be closed tomorrow 2/21. Pt advised to call back and reschedule o/v.

## 2018-07-29 ENCOUNTER — Ambulatory Visit: Payer: BLUE CROSS/BLUE SHIELD | Admitting: Neurology

## 2018-08-09 ENCOUNTER — Other Ambulatory Visit: Payer: Self-pay | Admitting: Neurology

## 2018-08-15 ENCOUNTER — Telehealth: Payer: Self-pay | Admitting: Neurology

## 2018-08-15 MED ORDER — PRAMIPEXOLE DIHYDROCHLORIDE 1.5 MG PO TABS: 1.5000 mg | ORAL_TABLET | Freq: Three times a day (TID) | ORAL | 0 refills | Status: DC

## 2018-08-15 NOTE — Telephone Encounter (Signed)
I contacted [facility] on [address] and requested PA form to be faxed to [phone] and I advised I did not receive the fax from last week.  Pa request received and completed via cover my meds (Key: [id number]). Cover My Meds stated determination should be made within 3 days.   Will fwd to Dr. Anne Hahn to advise on taking mirapex 1.5 mg until PA is approved for Pramipexole Dihydrochloride 4.5 mg

## 2018-08-15 NOTE — Telephone Encounter (Signed)
I called the patient, I will send in a 1 week supply of the 1.5 mg generic Mirapex until he can get the long-acting preparation.

## 2018-08-15 NOTE — Telephone Encounter (Signed)
Pt said Pramipexole Dihydrochloride 4.5 MG TB24 needs PA per [facility]. He said they have faxed a PA request 1 week ago (I did not see any documentation of PA request)  Pt said he has 1-1/2 tabs left. He is wanting to know if he could take mirapex 1.5 mg to hold him until PA is approved. Pt will need script sent in.

## 2018-08-16 NOTE — Telephone Encounter (Signed)
[employer] is reviewing PA request. Should receive determination in 7 calendar days. I spoke with Kathlen Mody, W and was advised we should receive a fax with the determination.  [employer] required further information stating the pt had tried/failed [employer]. I have provided this information.

## 2018-08-23 NOTE — Telephone Encounter (Signed)
Received PA approval for Pramipexole ER 4.5 mg ref # [id number] approval effective dates 08/15/18-08/13/2021.

## 2018-08-24 ENCOUNTER — Ambulatory Visit: Payer: Self-pay | Admitting: Nurse Practitioner

## 2018-08-24 MED ORDER — PRAMIPEXOLE DIHYDROCHLORIDE 1.5 MG PO TABS: 1.5000 mg | ORAL_TABLET | Freq: Three times a day (TID) | ORAL | 3 refills | Status: DC

## 2018-08-24 NOTE — Telephone Encounter (Signed)
Patient called in and stated a 90day supply $1,000 and he wants to know what he can do because he cant afford it at this price

## 2018-08-24 NOTE — Telephone Encounter (Signed)
I called the patient.  He will not be able to afford the long-acting preparation of the Mirapex.  I will send in the generic short acting drug.

## 2018-08-24 NOTE — Addendum Note (Signed)
Addended by: York Spaniel on: 08/24/2018 05:12 PM   Modules accepted: Orders

## 2018-08-24 NOTE — Telephone Encounter (Signed)
I contacted [facility] on [address] and spoke with the pharmacy tech. She states the pt's PA for the 4.5 ER Pramipexole was received and processed. With the PA approval the cost of the medication is going to be 1000 $ for a 90 day supply and 339$ for a 30 day supply.  I asked what the price would be if the pt was to use a good Rx card and the monthly amount would be 141.36 $. I have spoke with the pt and this price is still too expensive.  Pt states he is down to two pills of the regular release pramipexole and would like to know what Dr. Anne Hahn would recommended.  I advised I would fwd message to Dr. Anne Hahn to review/advise pt was agreeable.

## 2018-08-25 IMAGING — CR DG CHEST 2V
2 series · 2 of 2 positions shown · non-contrast
Comparison: None.

CLINICAL DATA: Cough and wheezing

EXAM:
CHEST  2 VIEW

[w chest pa]
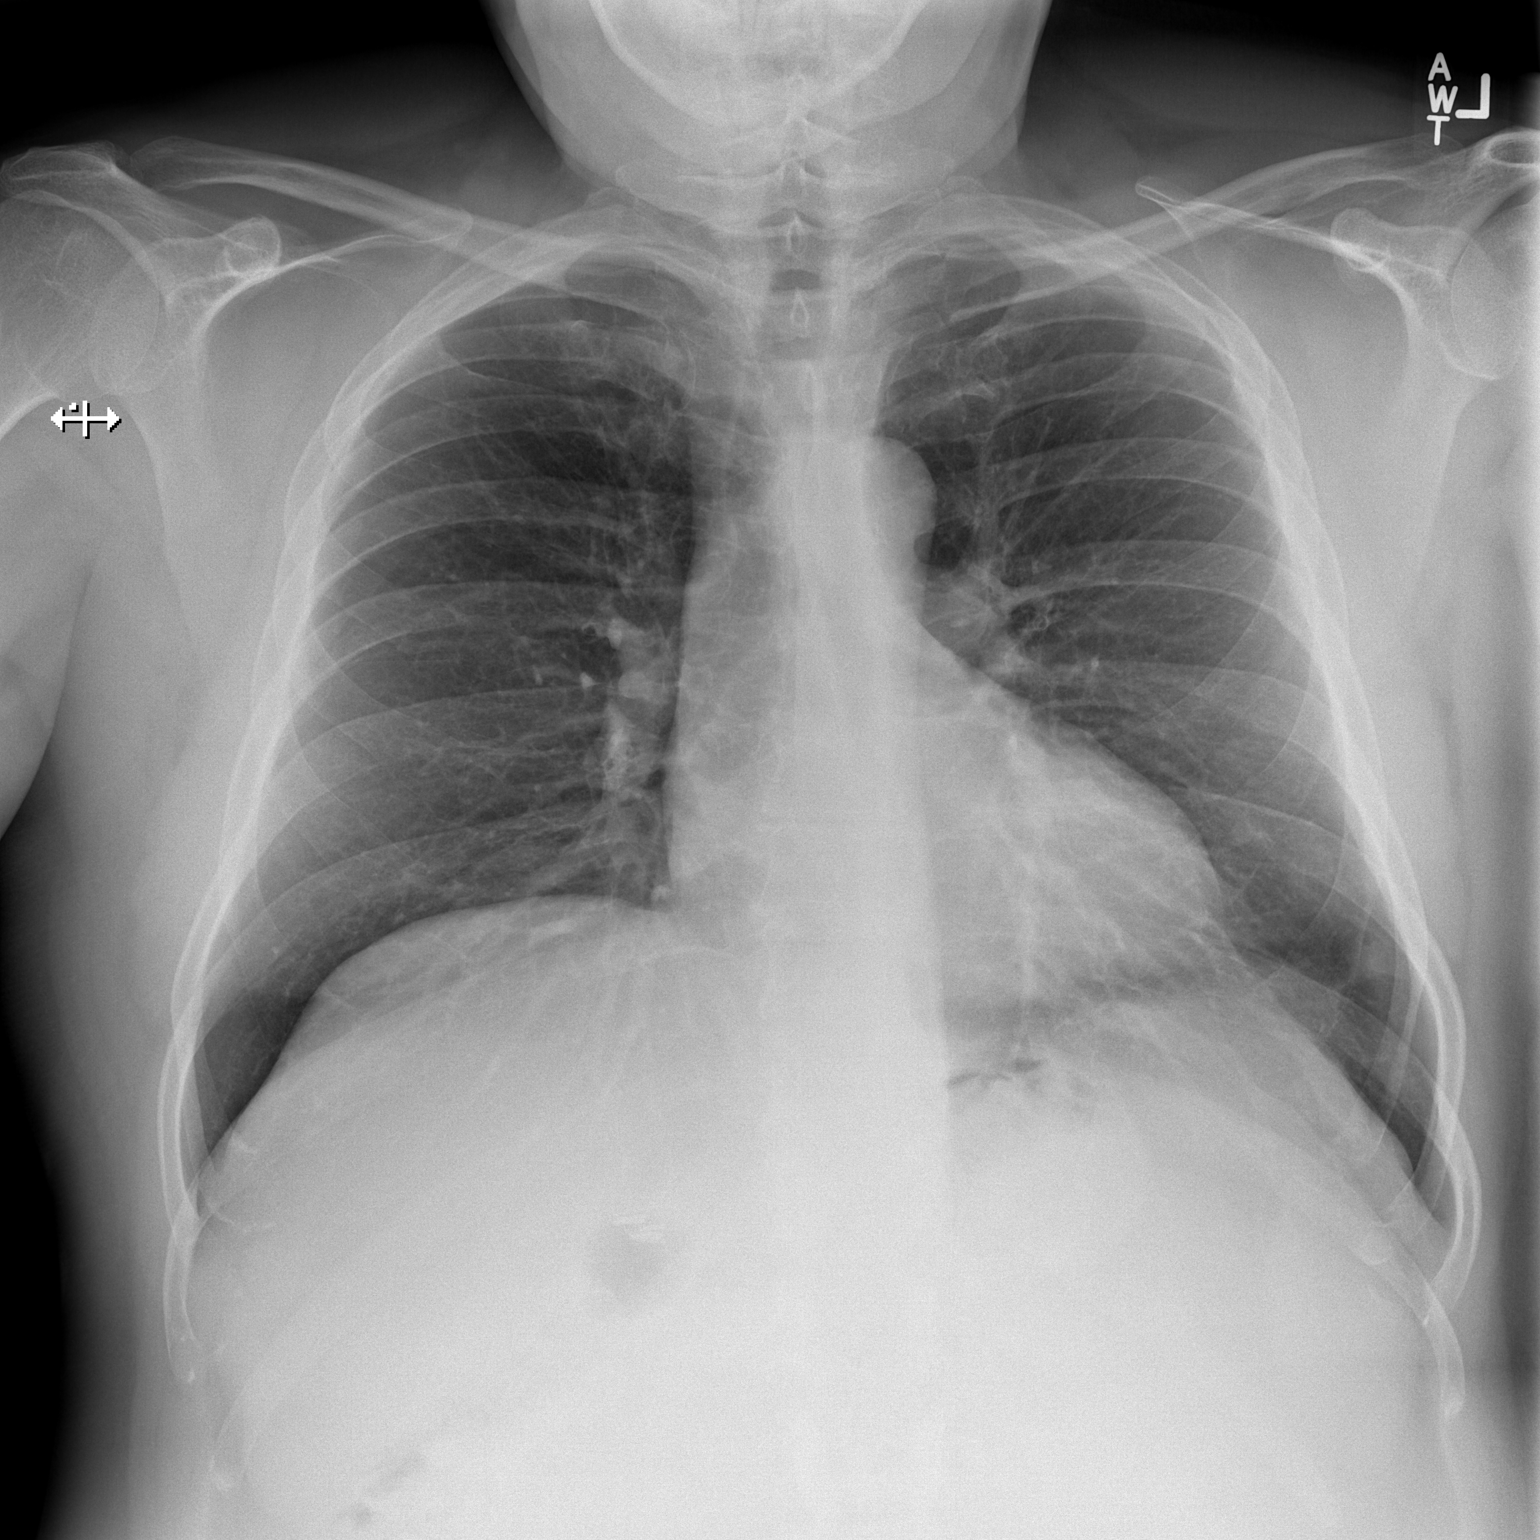

[w chest lat]
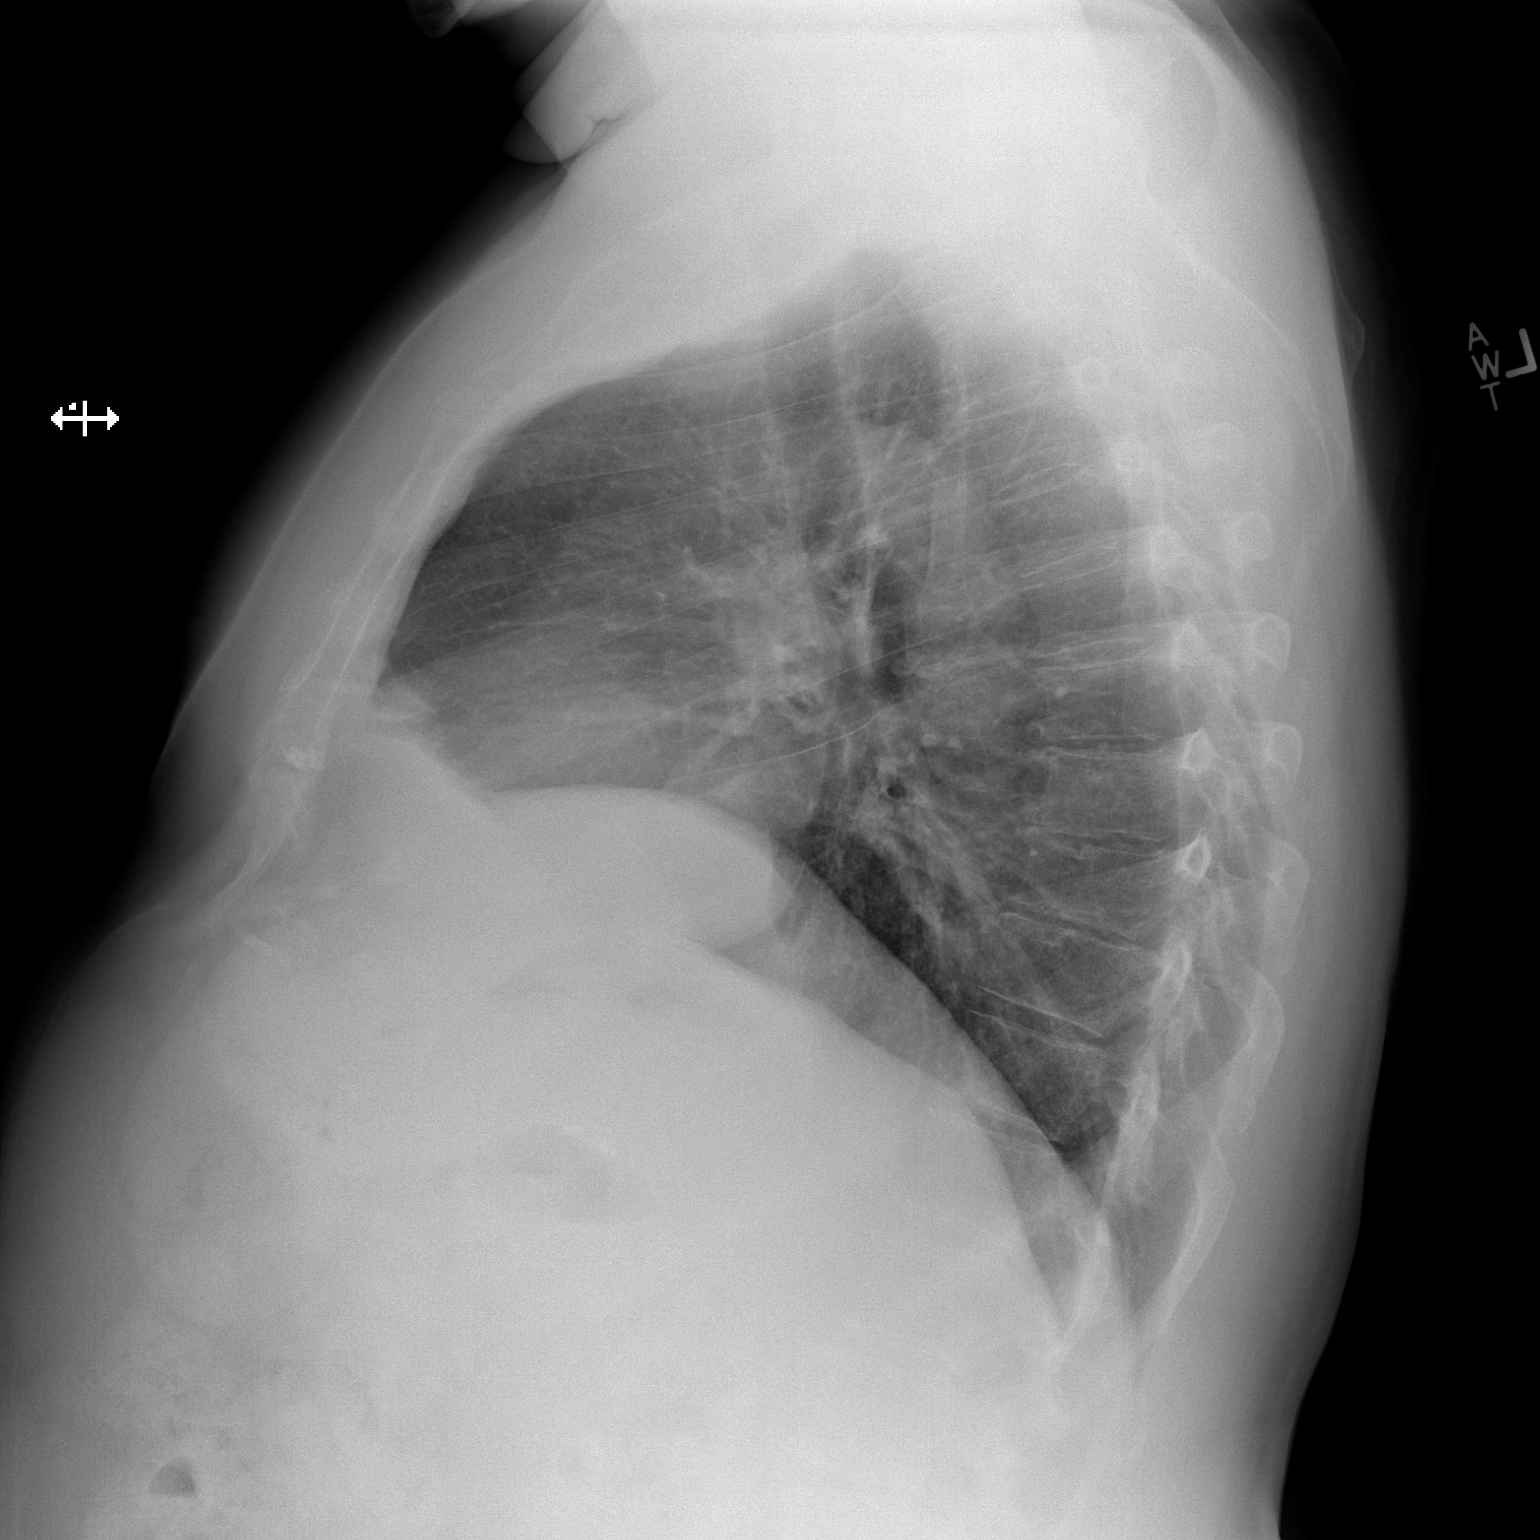

[2 of 2 positions shown; findings below may reference images not displayed]

FINDINGS: There is a questionable nipple shadow on the left. There is no edema
or consolidation. Heart size and pulmonary vascularity are normal.
No adenopathy. No bone lesions.
IMPRESSION: Questionable nipple shadow on the left. Advise repeat study with
nipple markers to confirm. No edema or consolidation.

## 2018-08-25 IMAGING — CT CT CHEST W/O CM
2 of 4 series · 15 of 36 positions shown, 18 images · non-contrast
Comparison: Chest radiograph 07/25/2016

CLINICAL DATA: Persistent cough, cough for 16 weeks, expiratory
wheezing, history hypertension, Parkinson's

EXAM:
CT CHEST WITHOUT CONTRAST
TECHNIQUE: Multidetector CT imaging of the chest was performed following the
standard protocol without IV contrast. Sagittal and coronal MPR
images reconstructed from axial data set.

[Series 2: chest w/o st · axial · non-contrast · 0.79mm/px · z∈[-236,+0]mm · 12 of 140 slices shown, 15 images]
[im 11/140  mediastinal]
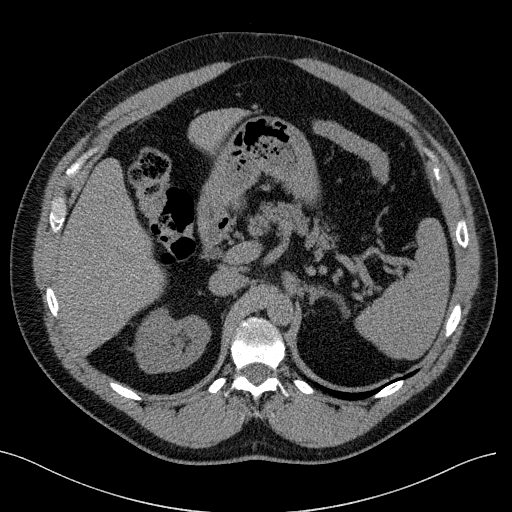
[im 11/140  lung]
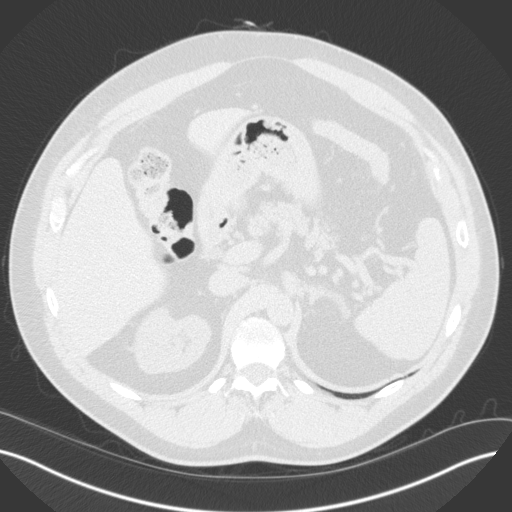
[im 22/140  lung]
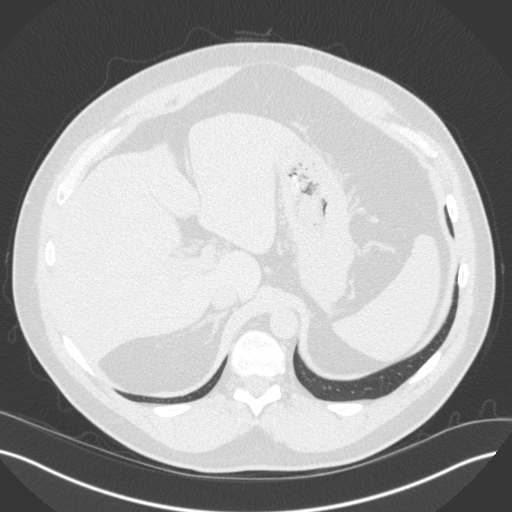
[im 33/140  lung]
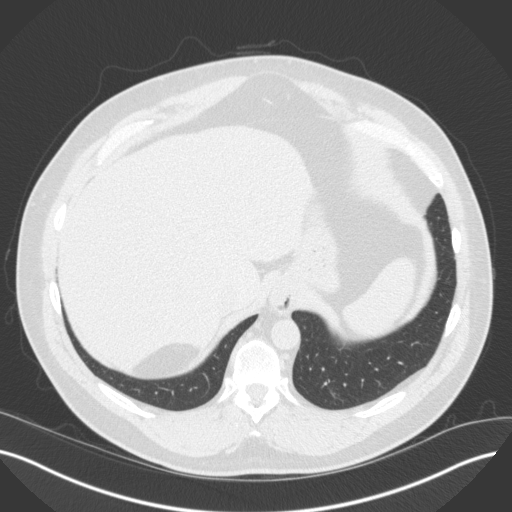
[im 43/140  lung]
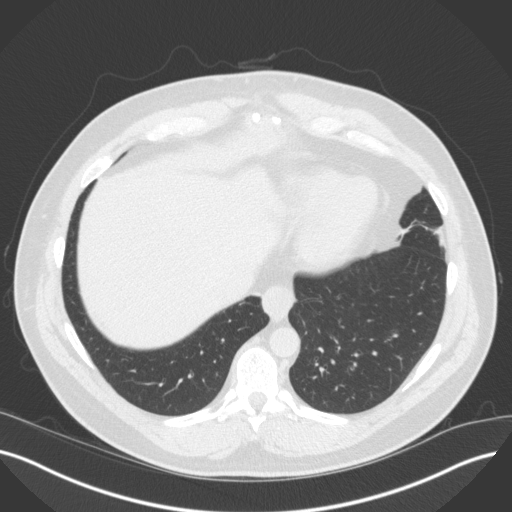
[im 54/140  mediastinal]
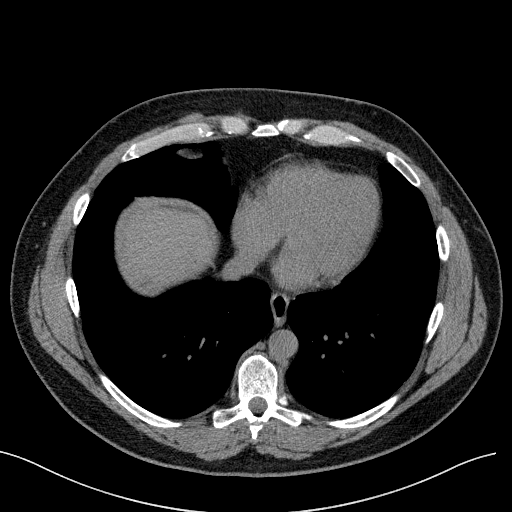
[im 54/140  lung]
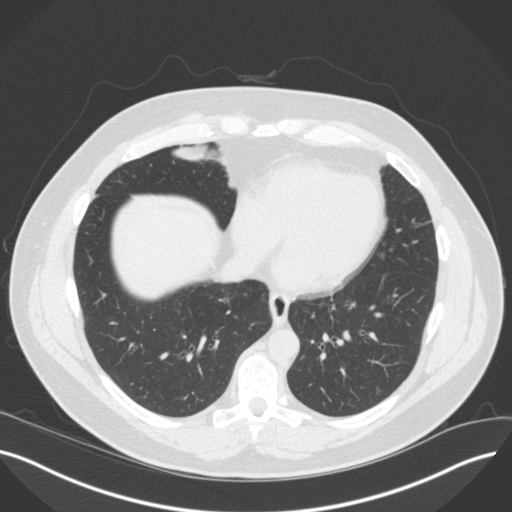
[im 65/140  lung]
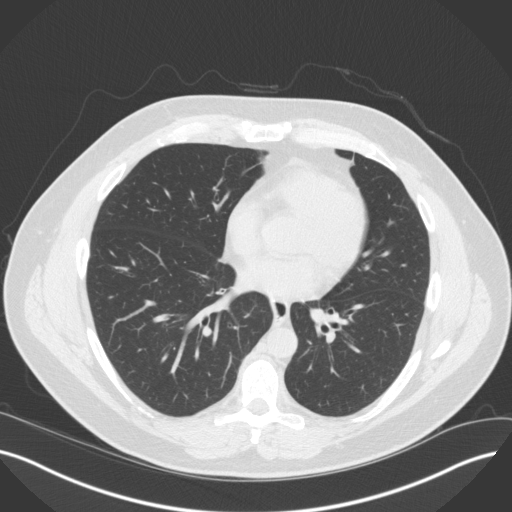
[im 75/140  lung]
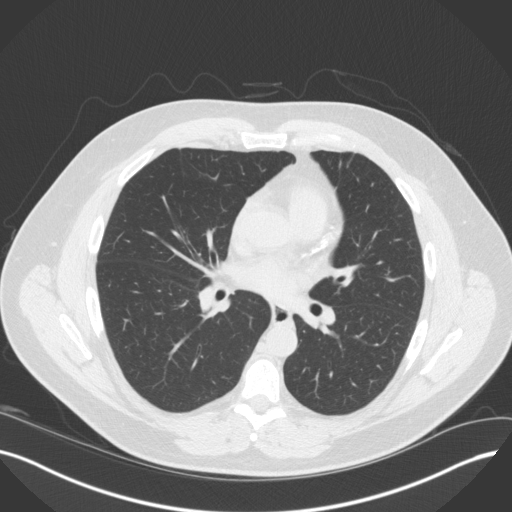
[im 86/140  lung]
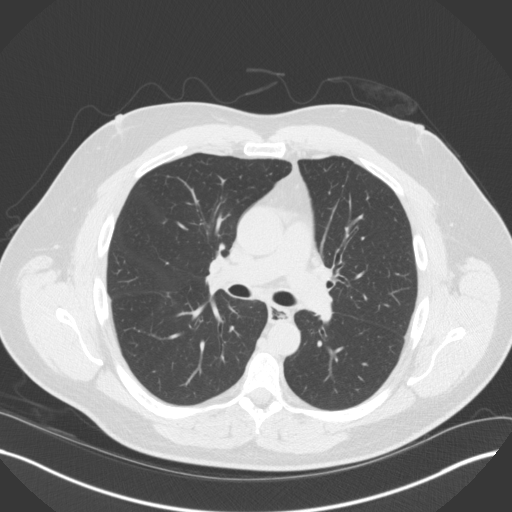
[im 97/140  mediastinal]
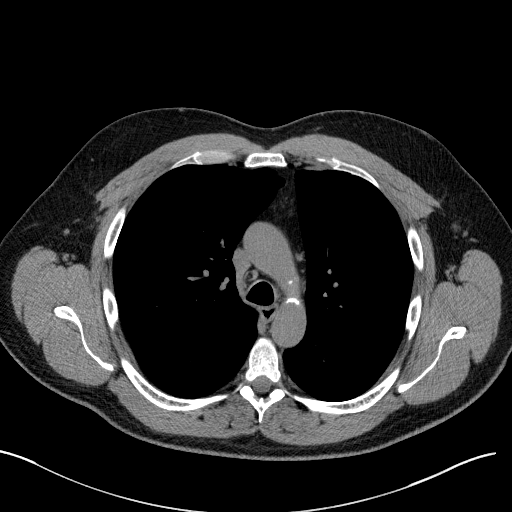
[im 97/140  lung]
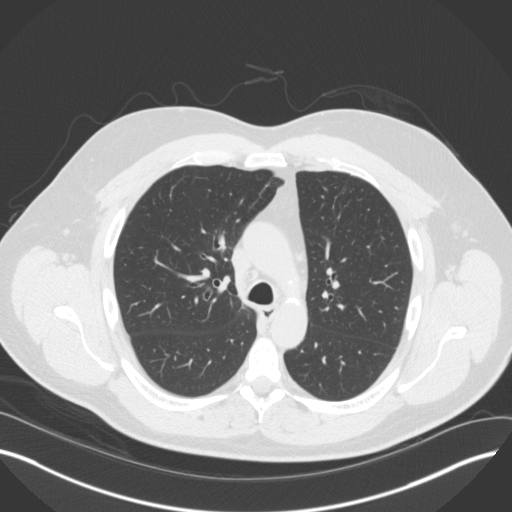
[im 107/140  lung]
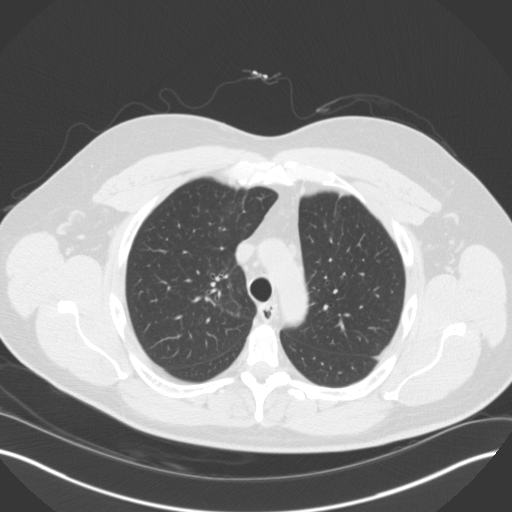
[im 118/140  lung]
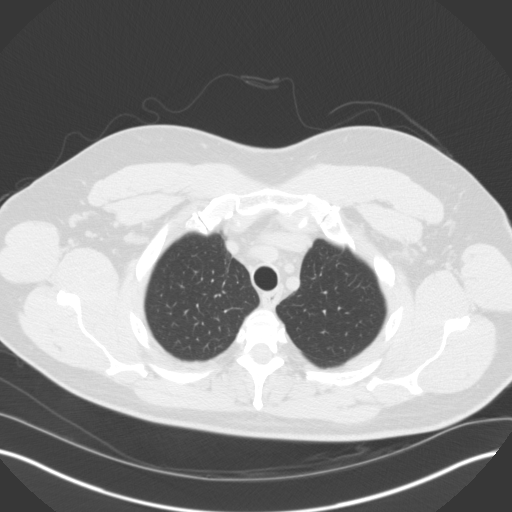
[im 129/140  lung]
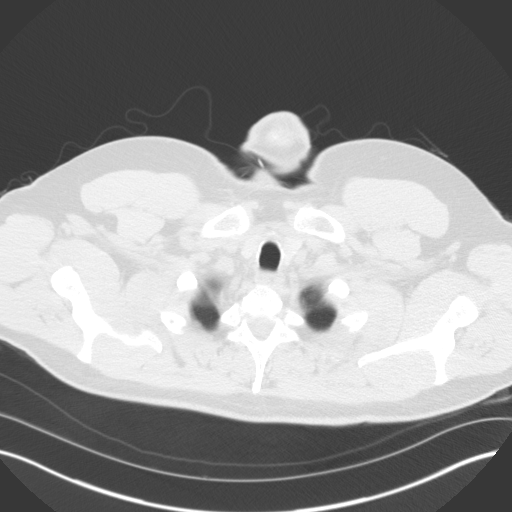

[Series 6: coronals · coronal · 0.57mm/px · 3 of 154 slices shown]
[im 31/154  lung]
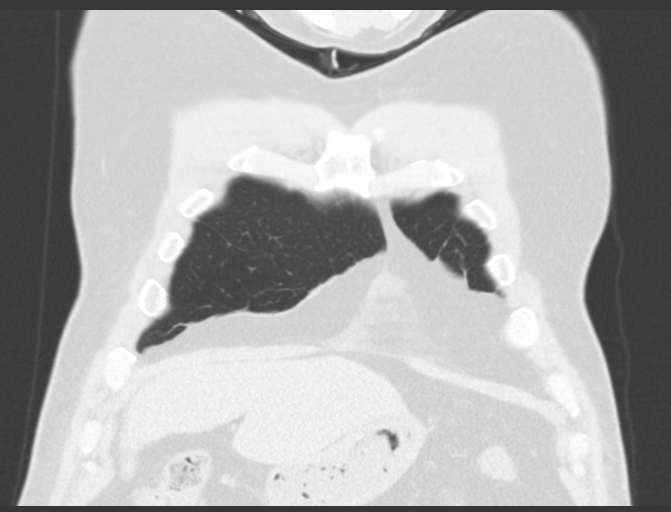
[im 62/154  lung]
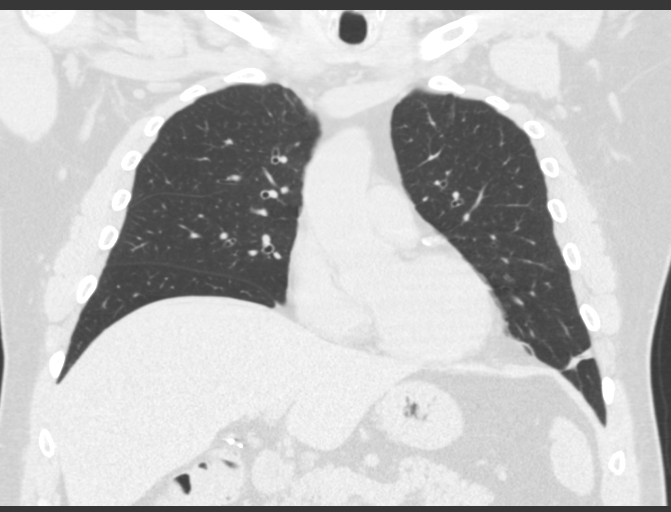
[im 92/154  lung]
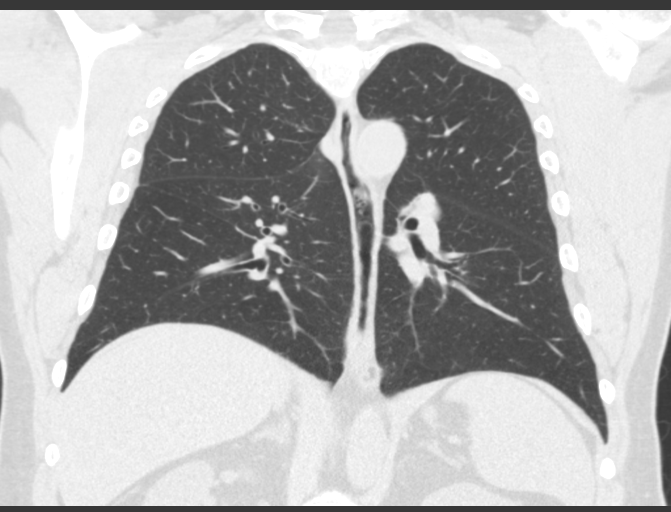

[15 of 36 positions shown; findings below may reference images not displayed]

FINDINGS: Cardiovascular: Atherosclerotic calcifications aorta and coronary
arteries. Aorta normal caliber. No significant pericardial fluid.

Mediastinum/Nodes: Tiny hiatal hernia. Esophagus otherwise
unremarkable. Few scattered normal size mesenteric lymph nodes
without thoracic adenopathy. Base of cervical region normal
appearance.

Lungs/Pleura: Minimal atelectasis versus scarring at the lingula. 4
mm nodular density at minor fissure image 54. Minimal scarring in
BILATERAL anterior upper lobes medially. No acute infiltrate,
pleural effusion, or pneumothorax. No definite pulmonary mass or
additional nodule.

Upper Abdomen: Gallbladder surgically absent. Visualized upper
abdomen otherwise unremarkable.

Musculoskeletal: Few scattered Schmorl's nodes at vertebral
endplates in thoracic spine. No acute osseous findings.
IMPRESSION: Tiny hiatal hernia.

Aortic atherosclerosis and coronary arterial calcification.

Minimal scarring atelectasis in both lungs as above without
infiltrate.

4 mm nonspecific nodule at minor fissure, recommendation below.

No follow-up needed if patient is low-risk. Non-contrast chest CT
can be considered in 12 months if patient is high-risk. This
recommendation follows the consensus statement: Guidelines for
Management of Incidental Pulmonary Nodules Detected on CT Images:

## 2018-09-05 ENCOUNTER — Ambulatory Visit: Payer: BLUE CROSS/BLUE SHIELD | Admitting: Family Medicine

## 2018-09-06 DIAGNOSIS — M5441 Lumbago with sciatica, right side: Secondary | ICD-10-CM | POA: Diagnosis not present

## 2018-11-30 DIAGNOSIS — Z0000 Encounter for general adult medical examination without abnormal findings: Secondary | ICD-10-CM | POA: Diagnosis not present

## 2018-11-30 DIAGNOSIS — Z6837 Body mass index (BMI) 37.0-37.9, adult: Secondary | ICD-10-CM | POA: Diagnosis not present

## 2018-11-30 DIAGNOSIS — Z125 Encounter for screening for malignant neoplasm of prostate: Secondary | ICD-10-CM | POA: Diagnosis not present

## 2018-12-27 ENCOUNTER — Encounter: Payer: Self-pay | Admitting: Neurology

## 2018-12-27 ENCOUNTER — Other Ambulatory Visit: Payer: Self-pay

## 2018-12-27 ENCOUNTER — Ambulatory Visit (INDEPENDENT_AMBULATORY_CARE_PROVIDER_SITE_OTHER): Payer: BC Managed Care – PPO | Admitting: Neurology

## 2018-12-27 VITALS — BP 127/84 | HR 79 | Temp 98.6°F | Ht 66.5 in | Wt 232.3 lb

## 2018-12-27 DIAGNOSIS — G2581 Restless legs syndrome: Secondary | ICD-10-CM | POA: Diagnosis not present

## 2018-12-27 DIAGNOSIS — G20 Parkinson's disease: Secondary | ICD-10-CM | POA: Diagnosis not present

## 2018-12-27 MED ORDER — CARBIDOPA-LEVODOPA 25-100 MG PO TABS: 1.5000 | ORAL_TABLET | Freq: Three times a day (TID) | ORAL | 2 refills | Status: DC

## 2018-12-27 NOTE — Progress Notes (Signed)
Reason for visit: Parkinson's disease, restless leg syndrome  Stephen Best is an 63 y.o. male  History of present illness:  Dr. Audrea MuscatBencuya is a 63 year old right-handed white male with a history of Parkinson's disease and restless leg syndrome.  The patient has sleep apnea but he is not using his CPAP machine as it does not allow him to sleep well.  He does have some fatigue during the day, he also reports some short-term memory issues.  He still works as a International aid/development workerveterinarian, he notes that his hand tremors are getting worse slightly over time, it is worse on the right more so than the left hand, and he also develops a jaw tremor at times.  Towards the end of the day with fatigue, his handwriting seems to deteriorate, he has micrographia and sloppy handwriting.  He is doing well with his restless leg syndrome.  The patient does have some mild balance issues, he has not had any falls.  He reports that his memory is gradually worsening over time.  He returns to the office today for an evaluation.  Past Medical History:  Diagnosis Date  . Degenerative arthritis   . HTN (hypertension)   . Movement disorder   . Obesity   . Parkinson disease ([facility]) 05/02/2014  . Reflux   . Restless leg syndrome 05/02/2014  . Vocal cord granuloma     Past Surgical History:  Procedure Laterality Date  . CHOLECYSTECTOMY    . KNEE SURGERY Right    Arthroscopic  . PAROTID GLAND TUMOR EXCISION     adenoma    Family History  Problem Relation Age of Onset  . Pancreatic cancer Mother   . Heart failure Father   . Kidney disease Father   . Dementia Father   . Diabetes Father   . Hypertension Brother   . Parkinsonism Neg Hx     Social history:  reports that he has never smoked. He has never used smokeless tobacco. He reports current alcohol use. He reports that he does not use drugs.   No Known Allergies  Medications:  Prior to Admission medications   Medication Sig Start Date End Date Taking? Authorizing  Provider  carbidopa-levodopa (SINEMET IR) 25-100 MG tablet Take 1 tablet by mouth 3 (three) times daily. 01/25/18  Yes York SpanielWillis, Charles K, MD  cholecalciferol (VITAMIN D) 1000 UNITS tablet Take 2,000 Units by mouth daily.   Yes [provider]  furosemide (LASIX) 20 MG tablet Take 20 mg by mouth daily.  01/14/17  Yes [provider]  gabapentin (NEURONTIN) 100 MG capsule TAKE 1 CAP ONCE A DAY FOR 1 WEEK THEN 2 CAPS FOR 1 WEEK THEN INCREASE TO 3 CAPS ONCE DAILY 10/07/18  Yes [provider]  losartan (COZAAR) 100 MG tablet Take 100 mg by mouth daily.  01/14/17  Yes [provider]  omeprazole (PRILOSEC) 40 MG capsule Take 40 mg by mouth daily.   Yes [provider]  pramipexole (MIRAPEX) 1.5 MG tablet Take 1 tablet (1.5 mg total) by mouth 3 (three) times daily. 08/24/18  Yes York SpanielWillis, Charles K, MD    ROS:  Out of a complete 14 system review of symptoms, the patient complains only of the following symptoms, and all other reviewed systems are negative.  Memory problems Tremor Balance problems  Blood pressure 127/84, pulse 79, temperature 98.6 F (37 C), temperature source Temporal, height 5' 6.5" (1.689 m), weight 232 lb 5 oz (105.4 kg).  Physical Exam  General: The patient  is alert and cooperative at the time of the examination.  The patient is moderately obese.  Skin: No significant peripheral edema is noted.   Neurologic Exam  Mental status: The patient is alert and oriented x 3 at the time of the examination. The patient has apparent normal recent and remote memory, with an apparently normal attention span and concentration ability.  Mini-Mental status examination reveals a score of 30/30.   Cranial nerves: Facial symmetry is present. Speech is normal, no aphasia or dysarthria is noted. Extraocular movements are full. Visual fields are full.  Mild masking of the face is seen.  Motor: The patient has good strength in all 4 extremities.  Sensory  examination: Soft touch sensation is symmetric on the face, arms, and legs.  Coordination: The patient has good finger-nose-finger and heel-to-shin bilaterally.  Gait and station: The patient has a normal gait, but he is slightly stooped and arm swing is depressed slightly bilaterally but more so on the right.  He is able to arise from a seated position with arms crossed.  Tandem gait is normal. Romberg is negative. No drift is seen.  Reflexes: Deep tendon reflexes are symmetric.   Assessment/Plan:  1.  Parkinson's disease  2.  Restless leg syndrome  3.  Reported memory disturbance  We will follow the memory issues over time.  The patient will be increased on Sinemet taking 1.5 tablets 3 times daily, a prescription was sent in.  He will continue on the current dose of the Mirapex.  We will follow-up in 6 months.  Jill Alexanders MD 12/27/2018 7:49 AM  [facility] [address] [place], [place] [address]  Phone [phone] Fax [phone]

## 2019-06-09 DIAGNOSIS — Z20828 Contact with and (suspected) exposure to other viral communicable diseases: Secondary | ICD-10-CM | POA: Diagnosis not present

## 2019-07-11 ENCOUNTER — Encounter: Payer: Self-pay | Admitting: Neurology

## 2019-07-11 ENCOUNTER — Ambulatory Visit (INDEPENDENT_AMBULATORY_CARE_PROVIDER_SITE_OTHER): Payer: BC Managed Care – PPO | Admitting: Neurology

## 2019-07-11 ENCOUNTER — Other Ambulatory Visit: Payer: Self-pay

## 2019-07-11 VITALS — BP 146/87 | HR 85 | Temp 96.6°F | Ht 66.5 in | Wt 240.0 lb

## 2019-07-11 DIAGNOSIS — G2581 Restless legs syndrome: Secondary | ICD-10-CM

## 2019-07-11 DIAGNOSIS — G20 Parkinson's disease: Secondary | ICD-10-CM | POA: Diagnosis not present

## 2019-07-11 MED ORDER — PRAMIPEXOLE DIHYDROCHLORIDE 1.5 MG PO TABS: 1.5000 mg | ORAL_TABLET | Freq: Three times a day (TID) | ORAL | 3 refills | Status: DC

## 2019-07-11 NOTE — Progress Notes (Signed)
Reason for visit: Parkinson's disease  Stephen Best is an 64 y.o. male  History of present illness:  Stephen Best is a 68 year old right-handed white male with a history of Parkinson's disease.  The patient has done quite well over the last 5 years with minimal progression.  He has some tremors at times involving the right upper extremity, he has occasional jaw tremors and he is drooling a bit more than usual.  He does have a history of sleep apnea but cannot tolerate the CPAP.  He has restless leg syndrome and is on Mirapex and Sinemet without full control of the restless legs.  The patient is still working as a Animal nutritionist.  He denies any issues with speech or swallowing.  He does report micrographia.  He is tolerating the medications well.  Past Medical History:  Diagnosis Date  . Degenerative arthritis   . HTN (hypertension)   . Movement disorder   . Obesity   . Parkinson disease ([place]) 05/02/2014  . Reflux   . Restless leg syndrome 05/02/2014  . Vocal cord granuloma     Past Surgical History:  Procedure Laterality Date  . CHOLECYSTECTOMY    . KNEE SURGERY Right    Arthroscopic  . PAROTID GLAND TUMOR EXCISION     adenoma    Family History  Problem Relation Age of Onset  . Pancreatic cancer Mother   . Heart failure Father   . Kidney disease Father   . Dementia Father   . Diabetes Father   . Hypertension Brother   . Parkinsonism Neg Hx     Social history:  reports that he has never smoked. He has never used smokeless tobacco. He reports current alcohol use. He reports that he does not use drugs.   No Known Allergies  Medications:  Prior to Admission medications   Medication Sig Start Date End Date Taking? Authorizing Provider  carbidopa-levodopa (SINEMET IR) 25-100 MG tablet Take 1.5 tablets by mouth 3 (three) times daily. 12/27/18  Yes Kathrynn Ducking, MD  cholecalciferol (VITAMIN D) 1000 UNITS tablet Take 2,000 Units by mouth daily.   Yes [provider]  furosemide (LASIX) 20 MG tablet Take 20 mg by mouth daily.  01/14/17  Yes [provider]  gabapentin (NEURONTIN) 300 MG capsule Take 300 mg by mouth daily. 04/11/19  Yes [provider]  losartan (COZAAR) 100 MG tablet Take 100 mg by mouth daily.  01/14/17  Yes [provider]  omeprazole (PRILOSEC) 40 MG capsule Take 40 mg by mouth daily.   Yes [provider]  pramipexole (MIRAPEX) 1.5 MG tablet Take 1 tablet (1.5 mg total) by mouth 3 (three) times daily. 08/24/18  Yes Kathrynn Ducking, MD    ROS:  Out of a complete 14 system review of symptoms, the patient complains only of the following symptoms, and all other reviewed systems are negative.  Tremor Drooling Mild memory problems  Blood pressure (!) 146/87, pulse 85, temperature (!) 96.6 F (35.9 C), height 5' 6.5" (1.689 m), weight 240 lb (108.9 kg).  Physical Exam  General: The patient is alert and cooperative at the time of the examination.  The patient is moderately to markedly obese.  Skin: No significant peripheral edema is noted.   Neurologic Exam  Mental status: The patient is alert and oriented x 3 at the time of the examination. The patient has apparent normal recent and remote memory, with an apparently normal attention span and concentration ability.  Cranial nerves: Facial symmetry is present. Speech is normal, no aphasia or dysarthria is noted. Extraocular movements are full. Visual fields are full.  Mild masking of the face is seen.  Motor: The patient has good strength in all 4 extremities.  Sensory examination: Soft touch sensation is symmetric on the face, arms, and legs.  Coordination: The patient has good finger-nose-finger and heel-to-shin bilaterally.  No resting tremor is noted.  Gait and station: The patient has a normal gait, with exception of decreased arm swing with the right arm. Tandem gait is normal. Romberg is negative. No drift is  seen.  Reflexes: Deep tendon reflexes are symmetric.   Assessment/Plan:  1.  Parkinson's disease  2.  Reported mild memory problems, stable  3.  Restless leg syndrome  The patient claims that his memory issues are stable at this time.  We will continue his current dose of Sinemet taking the 25/100 mg tablets, take 1.5 tablets 3 times daily.  He is on 1.5 mg of the Mirapex 3 times daily, a prescription was sent in.  He will follow-up here in 6 months.  Marlan Palau MD 07/11/2019 7:35 AM  [facility] [address] [place], [place] [address]  Phone [phone] Fax [phone]

## 2019-09-24 ENCOUNTER — Other Ambulatory Visit: Payer: Self-pay | Admitting: Neurology

## 2019-12-12 DIAGNOSIS — Z13220 Encounter for screening for lipoid disorders: Secondary | ICD-10-CM | POA: Diagnosis not present

## 2019-12-12 DIAGNOSIS — G4733 Obstructive sleep apnea (adult) (pediatric): Secondary | ICD-10-CM | POA: Diagnosis not present

## 2019-12-12 DIAGNOSIS — I10 Essential (primary) hypertension: Secondary | ICD-10-CM | POA: Diagnosis not present

## 2019-12-12 DIAGNOSIS — Z125 Encounter for screening for malignant neoplasm of prostate: Secondary | ICD-10-CM | POA: Diagnosis not present

## 2019-12-12 DIAGNOSIS — Z0000 Encounter for general adult medical examination without abnormal findings: Secondary | ICD-10-CM | POA: Diagnosis not present

## 2019-12-15 ENCOUNTER — Telehealth: Payer: Self-pay | Admitting: Neurology

## 2019-12-15 DIAGNOSIS — R110 Nausea: Secondary | ICD-10-CM | POA: Diagnosis not present

## 2019-12-15 DIAGNOSIS — H8110 Benign paroxysmal vertigo, unspecified ear: Secondary | ICD-10-CM | POA: Diagnosis not present

## 2019-12-15 MED ORDER — CARBIDOPA-LEVODOPA ER 50-200 MG PO TBCR: 1.0000 | EXTENDED_RELEASE_TABLET | Freq: Three times a day (TID) | ORAL | 1 refills | Status: DC

## 2019-12-15 NOTE — Telephone Encounter (Signed)
I called the patient.  The patient is having troubles with his handwriting for his in the day, I will switch him to the Sinemet CR 50/200 mg tablets having 1 tablet 3 times daily.  He claims that yesterday he began having some vertigo, he has had recurrent vertigo in the past usually associated with allergies.  Earlier in the week he went to his primary care physician who noted a buildup of wax in the ears, he will be going to an urgent medical care center today.  He has taken some Dramamine which allowed him to sleep, he has some nausea with vertigo.

## 2019-12-15 NOTE — Telephone Encounter (Signed)
Pt states his handwriting gets worse by the end of the day and is wanting to know if something can be done for that. Please advise.

## 2020-01-17 DIAGNOSIS — Z20822 Contact with and (suspected) exposure to covid-19: Secondary | ICD-10-CM | POA: Diagnosis not present

## 2020-01-22 ENCOUNTER — Encounter: Payer: Self-pay | Admitting: Neurology

## 2020-01-22 ENCOUNTER — Ambulatory Visit (INDEPENDENT_AMBULATORY_CARE_PROVIDER_SITE_OTHER): Payer: BC Managed Care – PPO | Admitting: Neurology

## 2020-01-22 VITALS — BP 136/84 | HR 90 | Ht 66.5 in | Wt 241.0 lb

## 2020-01-22 DIAGNOSIS — G20 Parkinson's disease: Secondary | ICD-10-CM | POA: Diagnosis not present

## 2020-01-22 DIAGNOSIS — G2581 Restless legs syndrome: Secondary | ICD-10-CM

## 2020-01-22 NOTE — Progress Notes (Signed)
Reason for visit: Parkinson's disease  Stephen Best is an 64 y.o. male  History of present illness:  Stephen Best is a 42 year old right-handed white male with a history of Parkinson's disease.  The patient has a history of sleep apnea but he is not on CPAP due to lack of tolerance.  The patient has had some problems with micrographia, primarily in the afternoons.  His Sinemet was increased to the 50/200 mg CR tablet taking 1 tablet 3 times daily but he has not been able to tolerate this dose and has been taking the Sinemet IR tablet 25/100 mg in the midday dose.  The patient continues to have some troubles with drooling.  He has not had any major issues with walking or mobility.  He will be retiring in October 2021.  He works as a International aid/development worker.  He remains on Mirapex taking 1.5 mg 3 times daily.  Past Medical History:  Diagnosis Date  . Degenerative arthritis   . HTN (hypertension)   . Movement disorder   . Obesity   . Parkinson disease (HCC) 05/02/2014  . Reflux   . Restless leg syndrome 05/02/2014  . Vocal cord granuloma     Past Surgical History:  Procedure Laterality Date  . CHOLECYSTECTOMY    . KNEE SURGERY Right    Arthroscopic  . PAROTID GLAND TUMOR EXCISION     adenoma    Family History  Problem Relation Age of Onset  . Pancreatic cancer Mother   . Heart failure Father   . Kidney disease Father   . Dementia Father   . Diabetes Father   . Hypertension Brother   . Parkinsonism Neg Hx     Social history:  reports that he has never smoked. He has never used smokeless tobacco. He reports current alcohol use. He reports that he does not use drugs.   No Known Allergies  Medications:  Prior to Admission medications   Medication Sig Start Date End Date Taking? Authorizing Provider  carbidopa-levodopa (SINEMET CR) 50-200 MG tablet Take 1 tablet by mouth in the morning, at noon, and at bedtime. 12/15/19  Yes York Spaniel, MD  cholecalciferol (VITAMIN D) 1000  UNITS tablet Take 2,000 Units by mouth daily.   Yes [provider]  gabapentin (NEURONTIN) 300 MG capsule Take 300 mg by mouth daily. 04/11/19  Yes [provider]  omeprazole (PRILOSEC) 40 MG capsule Take 40 mg by mouth daily.   Yes [provider]  pramipexole (MIRAPEX) 1.5 MG tablet Take 1 tablet (1.5 mg total) by mouth 3 (three) times daily. 07/11/19  Yes York Spaniel, MD  triamterene-hydrochlorothiazide (MAXZIDE-25) 37.5-25 MG tablet Take 1 tablet by mouth every morning. 12/13/19  Yes [provider]    ROS:  Out of a complete 14 system review of symptoms, the patient complains only of the following symptoms, and all other reviewed systems are negative.  Drowsiness Tremor Drooling  Blood pressure 136/84, pulse 90, height 5' 6.5" (1.689 m), weight 241 lb (109.3 kg), SpO2 95 %.  Physical Exam  General: The patient is alert and cooperative at the time of the examination.  The patient is moderately to markedly obese.  Skin: No significant peripheral edema is noted.   Neurologic Exam  Mental status: The patient is alert and oriented x 3 at the time of the examination. The patient has apparent normal recent and remote memory, with an apparently normal attention span and concentration ability.   Cranial nerves: Facial symmetry  is present. Speech is normal, no aphasia or dysarthria is noted. Extraocular movements are full. Visual fields are full.  Motor: The patient has good strength in all 4 extremities.  Sensory examination: Soft touch sensation is symmetric on the face, arms, and legs.  Coordination: The patient has good finger-nose-finger and heel-to-shin bilaterally.  Gait and station: The patient has a normal gait.  The patient is able to rise from a seated position with arms crossed.  Tandem gait is normal. Romberg is negative. No drift is seen.  Reflexes: Deep tendon reflexes are symmetric.   Assessment/Plan:  1.  Parkinson's  disease  The patient is doing fairly well with his mobility.  He does report some difficulty with handwriting and drooling.  He is having some daytime drowsiness on the medication, in part this may be related to his sleep apnea.  He will continue his current dose of medications and follow-up here in 6 months.  He is to remain active, hopefully he can get into a regular exercise program after he retires.  Marlan Palau MD 01/22/2020 11:04 AM  [facility] [address] [place], [place] [address]  Phone [phone] Fax [phone]

## 2020-06-10 ENCOUNTER — Other Ambulatory Visit: Payer: Self-pay | Admitting: Neurology

## 2020-07-25 ENCOUNTER — Ambulatory Visit (INDEPENDENT_AMBULATORY_CARE_PROVIDER_SITE_OTHER): Payer: 59 | Admitting: Neurology

## 2020-07-25 ENCOUNTER — Encounter: Payer: Self-pay | Admitting: Neurology

## 2020-07-25 VITALS — BP 161/98 | HR 76 | Ht 66.0 in | Wt 244.6 lb

## 2020-07-25 DIAGNOSIS — G2581 Restless legs syndrome: Secondary | ICD-10-CM

## 2020-07-25 DIAGNOSIS — G20A1 Parkinson's disease without dyskinesia, without mention of fluctuations: Secondary | ICD-10-CM

## 2020-07-25 DIAGNOSIS — H532 Diplopia: Secondary | ICD-10-CM

## 2020-07-25 DIAGNOSIS — G20 Parkinson's disease: Secondary | ICD-10-CM | POA: Diagnosis not present

## 2020-07-25 MED ORDER — ALPRAZOLAM 0.5 MG PO TABS: 0.5000 mg | ORAL_TABLET | Freq: Three times a day (TID) | ORAL | 0 refills | Status: DC | PRN

## 2020-07-25 MED ORDER — PRAMIPEXOLE DIHYDROCHLORIDE 1.5 MG PO TABS: 1.5000 mg | ORAL_TABLET | Freq: Three times a day (TID) | ORAL | 3 refills | Status: DC

## 2020-07-25 MED ORDER — CARBIDOPA-LEVODOPA 25-250 MG PO TABS: 1.0000 | ORAL_TABLET | Freq: Three times a day (TID) | ORAL | 3 refills | Status: DC

## 2020-07-25 MED ORDER — GABAPENTIN 300 MG PO CAPS: 600.0000 mg | ORAL_CAPSULE | Freq: Every day | ORAL | 3 refills | Status: DC

## 2020-07-25 NOTE — Progress Notes (Signed)
Reason for visit: Parkinson's disease  Stephen Best is an 65 y.o. male  History of present illness:  Stephen Best is a 44 year old right-handed white male with a history of Parkinson's disease, obesity, and sleep apnea.  The patient has restless leg syndrome that he indicates has gotten much worse.  He is having a lot of trouble resting at night.  He takes 2-1/2 of the 1.5 Mirapex tablets at night, he is on Sinemet taking 50/200 mg CR tablets, 1 tablet 3 times daily.  The patient has also noted some other issues.  He has noted some blurring of vision in the evening when he is tired, he does not note overt double vision.  He denies any other problems with chewing or swallowing.  He has noted some numbness in the fourth and fifth fingers of the left hand over the last 2 weeks, he denies neck pain or pain down the left arm.  He has also noted some increased problems with shuffling his feet with walking.  He denies any tremors.  He returns to the office today for an evaluation.  He is planning a long trip to [place], he is worried about the restless leg syndrome on the trip.  Past Medical History:  Diagnosis Date  . Degenerative arthritis   . HTN (hypertension)   . Movement disorder   . Obesity   . Parkinson disease (HCC) 05/02/2014  . Reflux   . Restless leg syndrome 05/02/2014  . Vocal cord granuloma     Past Surgical History:  Procedure Laterality Date  . CHOLECYSTECTOMY    . KNEE SURGERY Right    Arthroscopic  . PAROTID GLAND TUMOR EXCISION     adenoma    Family History  Problem Relation Age of Onset  . Pancreatic cancer Mother   . Heart failure Father   . Kidney disease Father   . Dementia Father   . Diabetes Father   . Hypertension Brother   . Parkinsonism Neg Hx     Social history:  reports that he has never smoked. He has never used smokeless tobacco. He reports current alcohol use. He reports that he does not use drugs.   No Known Allergies  Medications:   Prior to Admission medications   Medication Sig Start Date End Date Taking? Authorizing Provider  carbidopa-levodopa (SINEMET CR) 50-200 MG tablet TAKE 1 TABLET BY MOUTH IN THE MORNING, AT NOON, AND AT BEDTIME. 06/11/20  Yes York Spaniel, MD  cholecalciferol (VITAMIN D) 1000 UNITS tablet Take 2,000 Units by mouth daily.   Yes [provider]  gabapentin (NEURONTIN) 300 MG capsule Take 300 mg by mouth daily. 04/11/19  Yes [provider]  omeprazole (PRILOSEC) 40 MG capsule Take 40 mg by mouth daily.   Yes [provider]  pramipexole (MIRAPEX) 1.5 MG tablet Take 1 tablet (1.5 mg total) by mouth 3 (three) times daily. 07/11/19  Yes York Spaniel, MD  triamterene-hydrochlorothiazide (MAXZIDE-25) 37.5-25 MG tablet Take 1 tablet by mouth every morning. 12/13/19  Yes [provider]    ROS:  Out of a complete 14 system review of symptoms, the patient complains only of the following symptoms, and all other reviewed systems are negative.  Restless legs Tingling in the left hand Blurring of vision  Blood pressure (!) 161/98, pulse 76, height 5\' 6"  (1.676 m), weight 244 lb 9.6 oz (110.9 kg).  Physical Exam  General: The patient is alert and cooperative at the time of the  examination.  The patient is moderately to markedly obese.  Skin: No significant peripheral edema is noted.   Neurologic Exam  Mental status: The patient is alert and oriented x 3 at the time of the examination. The patient has apparent normal recent and remote memory, with an apparently normal attention span and concentration ability.   Cranial nerves: Facial symmetry is present. Speech is normal, no aphasia or dysarthria is noted. Extraocular movements are full. Visual fields are full.  Motor: The patient has good strength in all 4 extremities.  Sensory examination: Soft touch sensation is symmetric on the face, arms, and legs.  Coordination: The patient has good  finger-nose-finger and heel-to-shin bilaterally.  Gait and station: The patient has a normal gait.  He has good arm swing, good stride.  He is able to rise from a seated position with arms crossed.  Tandem gait is slightly unsteady. Romberg is negative. No drift is seen.  Reflexes: Deep tendon reflexes are symmetric.   Assessment/Plan:  1.  Parkinson's disease  2.  Reports of blurred vision  3.  Left fourth and fifth finger tingling  4.  Restless leg syndrome  The patient is having a lot of problems with restless legs currently.  We will add gabapentin at night taking 300 mg in the evening, he may go to 600 mg if needed.  I will give him a low-dose of alprazolam to take for his airplane trip in case the restless legs becomes quite severe.  We will check blood work to evaluate the blurred vision.  The left hand tingling appears to be in the ulnar nerve distribution, he is to be careful about compression on the elbow, he will wear an elbow pad to help protect the elbow.  The patient was given a prescription for Sinemet 25/250 mg tablets to take 1 tablet 3 times daily given the reports of increased shuffling.  He will follow up here in 5 to 6 months.  The patient has also reported some chronic issues with back pain without radiation down the legs.  When he returns from his trip, we may consider physical therapy for the low back.  Marlan Palau MD 07/25/2020 10:06 AM  [facility] [address] [place], [place] [address]  Phone [phone] Fax [phone]

## 2020-07-26 LAB — ACETYLCHOLINE RECEPTOR, BINDING: AChR Binding Ab, Serum: <0.03 nmol/L (ref 0.00–0.24)

## 2020-07-26 LAB — TSH: TSH: 1.07 u[IU]/mL (ref 0.450–4.500)

## 2020-07-26 LAB — VITAMIN B12: Vitamin B-12: 332 pg/mL (ref 232–1245)

## 2020-07-26 LAB — SEDIMENTATION RATE: Sed Rate: 2 mm/hr (ref 0–30)

## 2020-07-29 ENCOUNTER — Telehealth: Payer: Self-pay | Admitting: Emergency Medicine

## 2020-07-29 NOTE — Telephone Encounter (Signed)
-----   Message from York Spaniel, MD sent at 07/28/2020  5:51 PM EST -----  The blood work results are unremarkable. Please call the patient.  ----- Message ----- From: [facility] Results In Sent: 07/26/2020   7:37 AM EST To: York Spaniel, MD

## 2020-07-29 NOTE — Telephone Encounter (Signed)
Called patient and discussed Dr. Clarisa Kindred findings regarding blood work.  Patient denied further questions, verbalized understanding and expressed appreciation for the phone call.

## 2021-02-03 ENCOUNTER — Encounter: Payer: Self-pay | Admitting: Neurology

## 2021-02-03 ENCOUNTER — Ambulatory Visit (INDEPENDENT_AMBULATORY_CARE_PROVIDER_SITE_OTHER): Payer: 59 | Admitting: Neurology

## 2021-02-03 VITALS — BP 140/88 | HR 77 | Ht 66.0 in | Wt 241.8 lb

## 2021-02-03 DIAGNOSIS — G2581 Restless legs syndrome: Secondary | ICD-10-CM | POA: Diagnosis not present

## 2021-02-03 DIAGNOSIS — G20 Parkinson's disease: Secondary | ICD-10-CM | POA: Diagnosis not present

## 2021-02-03 NOTE — Patient Instructions (Signed)
May consider Optivia for weight loss.

## 2021-02-03 NOTE — Progress Notes (Signed)
Reason for visit: Parkinson's disease, restless leg syndrome  Stephen Best is an 65 y.o. male  History of present illness:  Stephen Best is a 73 year old right-handed white male with a history of Parkinson's disease and restless leg syndrome.  The patient is on Mirapex taking 2-1/2 of the 1.5 mg tablets in the evening.  He is on Sinemet taking the 25/250 mg tablets, 1 tablet 3 times daily.  He tolerates his medications fairly well.  He has had 1 brief episode of a visual hallucination of seeing a dog in the room that was not there, but this has not recurred.  He is somewhat overweight and complains of some low back pain.  He is functioning fairly well, he walks well, at times he may have some tremors in the hands.  He is having difficulty managing a mouse with either hand.  He returns for an evaluation.  Past Medical History:  Diagnosis Date   Degenerative arthritis    HTN (hypertension)    Movement disorder    Obesity    Parkinson disease (HCC) 05/02/2014   Reflux    Restless leg syndrome 05/02/2014   Vocal cord granuloma     Past Surgical History:  Procedure Laterality Date   CHOLECYSTECTOMY     KNEE SURGERY Right    Arthroscopic   PAROTID GLAND TUMOR EXCISION     adenoma    Family History  Problem Relation Age of Onset   Pancreatic cancer Mother    Heart failure Father    Kidney disease Father    Dementia Father    Diabetes Father    Hypertension Brother    Parkinsonism Neg Hx     Social history:  reports that he has never smoked. He has never used smokeless tobacco. He reports current alcohol use. He reports that he does not use drugs.    Allergies  Allergen Reactions   Lisinopril     Other reaction(s): cough    Medications:  Prior to Admission medications   Medication Sig Start Date End Date Taking? Authorizing Provider  ALPRAZolam Prudy Feeler) 0.5 MG tablet Take 1 tablet (0.5 mg total) by mouth 3 (three) times daily as needed for anxiety. 07/25/20  Yes York Spaniel, MD  carbidopa-levodopa (SINEMET) 25-250 MG tablet Take 1 tablet by mouth 3 (three) times daily. 07/25/20  Yes York Spaniel, MD  cholecalciferol (VITAMIN D) 1000 UNITS tablet Take 2,000 Units by mouth daily.   Yes [provider]  Omeprazole Magnesium (PRILOSEC OTC PO) Take 20 mg by mouth daily.   Yes [provider]  pramipexole (MIRAPEX) 1.5 MG tablet Take 1 tablet (1.5 mg total) by mouth 3 (three) times daily. 07/25/20  Yes York Spaniel, MD  triamterene-hydrochlorothiazide (MAXZIDE-25) 37.5-25 MG tablet Take 1 tablet by mouth every morning. 12/13/19  Yes [provider]  gabapentin (NEURONTIN) 300 MG capsule Take 2 capsules (600 mg total) by mouth at bedtime. Patient not taking: Reported on 02/03/2021 07/25/20   York Spaniel, MD  omeprazole (PRILOSEC) 40 MG capsule Take 40 mg by mouth daily. Patient not taking: Reported on 02/03/2021    [provider]    ROS:  Out of a complete 14 system review of symptoms, the patient complains only of the following symptoms, and all other reviewed systems are negative.  Back pain Leg swelling  Blood pressure 140/88, pulse 77, height 5\' 6"  (1.676 m), weight 241 lb 12.8 oz (109.7 kg).  Physical Exam  General: The  patient is alert and cooperative at the time of the examination.  Skin: 3+ edema is noted in both lower extremities..   Neurologic Exam  Mental status: The patient is alert and oriented x 3 at the time of the examination. The patient has apparent normal recent and remote memory, with an apparently normal attention span and concentration ability.   Cranial nerves: Facial symmetry is present. Speech is normal, no aphasia or dysarthria is noted. Extraocular movements are full. Visual fields are full.  Mild masking the face is seen.  Motor: The patient has good strength in all 4 extremities.  Sensory examination: Soft touch sensation is symmetric on the face, arms, and  legs.  Coordination: The patient has good finger-nose-finger and heel-to-shin bilaterally.  Gait and station: The patient has a normal gait. Tandem gait is normal. Romberg is negative. No drift is seen.  Reflexes: Deep tendon reflexes are symmetric.   Assessment/Plan:  1.  Parkinson's disease  2.  Restless leg syndrome  The patient really is having very minimal symptoms associated with Parkinson's disease.  He is mainly bothered by the restless leg syndrome.  He is having some low back pain, he needs to lose some weight, I have recommended a low glycemic diet.  He will follow up here in 6 months.  In the future, he can be seen through Dr. Marjory Lies.  Marlan Palau MD 02/03/2021 10:56 AM  [facility] [address] [place], [place] [address]  Phone [phone] Fax [phone]

## 2021-06-19 ENCOUNTER — Other Ambulatory Visit (HOSPITAL_BASED_OUTPATIENT_CLINIC_OR_DEPARTMENT_OTHER): Payer: Self-pay

## 2021-06-19 ENCOUNTER — Ambulatory Visit: Payer: BLUE CROSS/BLUE SHIELD | Attending: Internal Medicine

## 2021-06-19 DIAGNOSIS — Z23 Encounter for immunization: Secondary | ICD-10-CM

## 2021-06-19 MED ORDER — PFIZER COVID-19 VAC BIVALENT 30 MCG/0.3ML IM SUSP
INTRAMUSCULAR | 0 refills | Status: DC
  Filled 2021-06-19: qty 0.3, 1d supply, fill #0

## 2021-06-19 NOTE — Progress Notes (Signed)
° °  Covid-19 Vaccination Clinic  Name:  Stephen Best    MRN: [id number] DOB: 1956-04-15  06/19/2021  Mr. Stephen Best was observed post Covid-19 immunization for 15 minutes without incident. He was provided with Vaccine Information Sheet and instruction to access the V-Safe system.   Mr. Stephen Best was instructed to call 911 with any severe reactions post vaccine: Difficulty breathing  Swelling of face and throat  A fast heartbeat  A bad rash all over body  Dizziness and weakness   Immunizations Administered     Name Date Dose VIS Date Route   Pfizer Covid-19 Vaccine Bivalent Booster 06/19/2021  3:12 PM 0.3 mL 02/05/2021 Intramuscular   Manufacturer: [employer], [employer]   Lot: [id number]   NDC: [phone]

## 2021-06-30 ENCOUNTER — Encounter: Payer: Self-pay | Admitting: Neurology

## 2021-07-04 NOTE — Progress Notes (Signed)
Assessment/Plan:    1.  Parkinsons Disease , by hx  -Discussed with patient that he does not look particularly parkinsonian today.  This could be because he is on medication, but this would be somewhat usual for someone who has had the diagnosis for 8 years.  I would like him to hold his medication for the next visit.  He does not disagree that his symptoms seem not to have progressed.  In addition, he really is taking carbidopa/levodopa in the morning and then often misses it in the middle of the day and then does not take it again until midnight.  This lack of need for medication throughout the day would also suggest that he likely does not have Parkinson's disease.  -We discussed DaTscan.  Ultimately, we decided to hold on that until we see him off of medication next visit.  -He did have a neurologic opinion early on back in 2015 at diagnosis at [facility].  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  Bradykinesia really is required for the diagnosis of Parkinson's disease, but again would be more helpful to see him off of medication completely.  I believe that he was on pramipexole when he was seen by [facility] as well.  In addition, he no longer has blepharospasm and hemifacial spasm.  2.  Restless leg  -Patient really is on a very large dose of pramipexole.  This is a much larger dose than I would recommend for restless leg, it is likely causing augmentation.  He and I discussed the phenomenon of augmentation.  Pramipexole should not be given in a single dose that is this large, unless it is an extended release version, which is is not.  The extended release version is really not indicated for restless leg either.  Patient states that he really was very aware of this, but had researched it and did not find it harmful.  Whether or not it is harmful, it certainly causes augmentation.  -Would like to check  his iron stores to see if that is contributing.  I do not see if that is ever been done.  -Potentially could could try to start to decrease the pramipexole some with the addition of gabapentin.  We also discussed rotigotine.  Transitions certainly can be very difficult in patients who have restless leg with augmentation.  I think he is rightfully nervous and anxious, but I also do not feel comfortable dosing him with this type of dose on pramipexole.  Ultimately, I did not change anything today since I am just going to see him back in a few weeks.  I did, however, give him the option of following back up with [facility] neurology after he sees me next visit to determine whether or not he meets [place] [facility] criteria for the diagnosis of Parkinson's disease    Subjective:   Stephen Best was seen today in the movement disorders clinic for neurologic consultation at the request of [name], MD.  The consultation is for the evaluation of Parkinson's disease.  Patient has been seen at [facility] neurology for many years.  He also sought consultation at [facility] back in 2015.  Notes available to me are reviewed.  Patient seen [name] in 2015 primarily for issues of involuntary eye closure (had already been receiving onobotulinum toxin for the prior year and a half of 60 units) and longstanding (20+ years) restless leg  syndrome.  On examination, it was felt that the patient had parkinsonism with asymmetric rigidity, minimal decrementing bradykinesia and decreased right arm swing.  Felt that tremor was likely dystonic.  It was felt that patient could have Parkinson's disease or an atypical state given that patient had blepharospasm early on.  Neurocognitive testing was done at that point in time at the [facility] was the result.  [facility] felt that if the patient responded to levodopa, he really did not need a DaTscan quite yet, if at all.  He did not go back to [facility], as it  was just a one-time visit.  He resumed care at [facility] neurology.  He was started on levodopa.  He was already on pramipexole for restless leg.  Patient last seen by [facility] neurology, Dr. Anne Hahn, on February 03, 2021.  Current movement disorder medications as prescribed by [facility] neurology: Pramipexole, 1.5 mg, 2.5 tablets at bedtime (3.75 mg at bed) - pt states "its an overdosage, I know that." - pt states it occurs earlier and earlier in day with the pins and needles sensation.   Carbidopa/levodopa 25/250, 1 tablet 3 times per day (notes tremor if misses/skips it) - takes it about 7am/1pm (misses this one 75% of the time)/last one at midnight Xanax prn   Specific Symptoms:  Tremor: Yes.  , worse when he did vet sx and his face would "tremor under the face mask"  Family hx of similar:  No. Voice: some change but hx of vocal cord granuloma removed Sleep: has never slept well since teenage years  Vivid Dreams:  No.  Acting out dreams:  No. Wet Pillows: Yes.   Postural symptoms:  Yes.  , not as good as it used to be  Falls?  Yes.  , only one and that was over a year ago on wet cobblestone Bradykinesia symptoms: slow movements and drooling while awake; some dragging of the feet Loss of smell:  No. Loss of taste:  No. Urinary Incontinence:  No. But has some urgency Difficulty Swallowing:  No. Handwriting, micrographia: Yes.  , types mostly and has some trouble with that as well Trouble with ADL's:  No.  Trouble buttoning clothing: No. Depression:  No. 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   Hallucinations:  Yes.  , only one time a year ago while in [place] (saw a dog)  visual distortions: Yes.   N/V:  No. Lightheaded:  No.  Syncope: No. Diplopia:  Yes.  , at night.  Less if closes L eye.     Neuroimaging of the brain has previously been performed.   PREVIOUS MEDICATIONS: requip (for rls)  ALLERGIES:   Allergies  Allergen  Reactions   Lisinopril     Other reaction(s): cough    CURRENT MEDICATIONS:  Current Meds  Medication Sig   ALPRAZolam (XANAX) 0.5 MG tablet Take 1 tablet (0.5 mg total) by mouth 3 (three) times daily as needed for anxiety. (Patient taking differently: Take 0.5 mg by mouth 3 (three) times daily as needed for anxiety. For RLS while in flight)   carbidopa-levodopa (SINEMET) 25-250 MG tablet Take 1 tablet by mouth 3 (three) times daily.   cholecalciferol (VITAMIN D) 1000 UNITS tablet Take 2,000 Units by mouth daily.   COVID-19 mRNA bivalent vaccine, Pfizer, (PFIZER COVID-19 VAC BIVALENT) injection Inject into the muscle.   Omeprazole Magnesium (PRILOSEC OTC PO) Take 20 mg by mouth daily.  pramipexole (MIRAPEX) 1.5 MG tablet Take 1 tablet (1.5 mg total) by mouth 3 (three) times daily.   triamterene-hydrochlorothiazide (MAXZIDE-25) 37.5-25 MG tablet Take 1 tablet by mouth every morning.     Objective:   VITALS:   Vitals:   07/07/21 1242  BP: 124/86  Pulse: 74  SpO2: 96%  Weight: 243 lb (110.2 kg)  Height: 5\' 5"  (1.651 m)    GEN:  The patient appears stated age and is in NAD. HEENT:  Normocephalic, atraumatic.  The mucous membranes are moist. The superficial temporal arteries are without ropiness or tenderness. CV:  RRR Lungs:  CTAB Neck/HEME:  There are no carotid bruits bilaterally.  Neurological examination:  Orientation: The patient is alert and oriented x3.  Cranial nerves: There is good facial symmetry.  There is no significant facial hypomimia.  He has some periorbital edema around the right eye that he states is chronic.  Extraocular muscles are intact. The visual fields are full to confrontational testing. The speech is fluent and clear. Soft palate rises symmetrically and there is no tongue deviation. Hearing is intact to conversational tone. Sensation: Sensation is intact to light and pinprick throughout (facial, trunk, extremities). Vibration is intact at the bilateral  ankle. There is no extinction with double simultaneous stimulation. There is no sensory dermatomal level identified. Motor: Strength is 5/5 in the bilateral upper and lower extremities.   Shoulder shrug is equal and symmetric.  There is no pronator drift. Deep tendon reflexes: Deep tendon reflexes are 2/4 at the bilateral biceps, triceps, brachioradialis, patella and achilles. Plantar responses are downgoing bilaterally.  Movement examination: Tone: There is normal tone in the bilateral upper extremities.  The tone in the lower extremities is normal.  Abnormal movements: None Coordination:  There is no decremation with RAM's, with any form of RAMS, including alternating supination and pronation of the forearm, hand opening and closing, finger taps, heel taps and toe taps. Gait and Station: The patient has no difficulty arising out of a deep-seated chair without the use of the hands. The patient's stride length is good.  The patient has a negative pull test.     I have reviewed and interpreted the following labs independently   Chemistry      Component Value Date/Time   NA 143 09/16/2016 1707   K 3.8 09/16/2016 1707   CL 98 09/16/2016 1707   CO2 29 09/16/2016 1707   BUN 10 09/16/2016 1707   CREATININE 0.94 09/16/2016 1707      Component Value Date/Time   CALCIUM 9.2 09/16/2016 1707   ALKPHOS 70 09/16/2016 1707   AST 16 09/16/2016 1707   ALT 21 09/16/2016 1707   BILITOT 0.4 09/16/2016 1707      Lab Results  Component Value Date   TSH 1.070 07/25/2020   Lab Results  Component Value Date   WBC 6.8 09/16/2016   HGB 15.0 09/16/2016   HCT 44.3 09/16/2016   MCV 81.7 09/16/2016     Total time spent on today's visit was 62 minutes, including both face-to-face time and nonface-to-face time.  Time included that spent on review of records (prior notes available to me/labs/imaging if pertinent), discussing treatment and goals, answering patient's questions and coordinating care.  Cc:   Renford DillsPolite, Ronald, MD

## 2021-07-07 ENCOUNTER — Ambulatory Visit: Payer: Managed Care, Other (non HMO) | Admitting: Neurology

## 2021-07-07 ENCOUNTER — Other Ambulatory Visit (INDEPENDENT_AMBULATORY_CARE_PROVIDER_SITE_OTHER): Payer: Managed Care, Other (non HMO)

## 2021-07-07 ENCOUNTER — Other Ambulatory Visit: Payer: Self-pay

## 2021-07-07 ENCOUNTER — Encounter: Payer: Self-pay | Admitting: Neurology

## 2021-07-07 VITALS — BP 124/86 | HR 74 | Ht 65.0 in | Wt 243.0 lb

## 2021-07-07 DIAGNOSIS — G2581 Restless legs syndrome: Secondary | ICD-10-CM | POA: Diagnosis not present

## 2021-07-07 DIAGNOSIS — R6889 Other general symptoms and signs: Secondary | ICD-10-CM

## 2021-07-07 DIAGNOSIS — G20 Parkinson's disease: Secondary | ICD-10-CM

## 2021-07-07 DIAGNOSIS — Z5181 Encounter for therapeutic drug level monitoring: Secondary | ICD-10-CM | POA: Diagnosis not present

## 2021-07-07 DIAGNOSIS — D649 Anemia, unspecified: Secondary | ICD-10-CM | POA: Diagnosis not present

## 2021-07-07 LAB — CBC
HCT: 45.2 % (ref 39.0–52.0)
Hemoglobin: 14.6 g/dL (ref 13.0–17.0)
MCHC: 32.3 g/dL (ref 30.0–36.0)
MCV: 82.7 fl (ref 78.0–100.0)
Platelets: 248 10*3/uL (ref 150.0–400.0)
RBC: 5.46 Mil/uL (ref 4.22–5.81)
RDW: 15.1 % (ref 11.5–15.5)
WBC: 7.7 10*3/uL (ref 4.0–10.5)

## 2021-07-07 LAB — TSH: TSH: 1.43 u[IU]/mL (ref 0.35–5.50)

## 2021-07-07 NOTE — Patient Instructions (Addendum)
Hold carbidopa/levodopa 25/250 and pramipexole on 2/11 and 2/12 and I will see you on 2/13 Your provider has requested that you have labwork completed today. The lab is located on the Second floor at [address], within the [facility] office. When you get off the elevator, turn right and go in the [facility] [address]; the first brown door on the left.  Tell the ladies behind the desk that you are there for lab work. If you are not called within 15 minutes please check with the front desk.   Once you complete your labs you are free to go. You will receive a call or message via MyChart with your lab results.

## 2021-07-08 ENCOUNTER — Encounter: Payer: Self-pay | Admitting: Neurology

## 2021-07-08 LAB — IRON,TIBC AND FERRITIN PANEL
%SAT: 13 % (calc) — ABNORMAL LOW (ref 20–48)
Ferritin: 77 ng/mL (ref 24–380)
Iron: 47 ug/dL — ABNORMAL LOW (ref 50–180)
TIBC: 354 mcg/dL (calc) (ref 250–425)

## 2021-07-14 ENCOUNTER — Encounter: Payer: Self-pay | Admitting: Neurology

## 2021-07-18 NOTE — Progress Notes (Signed)
Assessment/Plan:    1.  Parkinsons Disease , by hx  -He still does not look parkinsonian.  He asked today what I thought his diagnosis was.  I told him that I did not think he had any movement disorder, but he unfortunately took his medication today.  I was hoping that he would hold it for today's visit.  We will reevaluate that at his next visit.  I did tell him that I certainly could be fooled by the presence of medication.  -We previously discussed DaTscan.  Ultimately, we decided to hold on that unless we think that we need him to do that once he is off medication.  -He did have a neurologic opinion early on back in 2015 at diagnosis at [facility].  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  Bradykinesia really is required for the diagnosis of Parkinson's disease, but again would be more helpful to see him off of medication completely.  I believe that he was on pramipexole when he was seen by [facility] as well.  In addition, he no longer has blepharospasm and hemifacial spasm.  2.  Restless leg  -Patient really is on a very large dose of pramipexole.  This is a much larger dose than I would recommend for restless leg, it is likely causing augmentation.  He and I discussed the phenomenon of augmentation.  Pramipexole should not be given in a single dose that is this large, unless it is an extended release version, which is is not.  He is currently taking 3.75 mg (2.5 tablets of the 1.5 mg pill).    -I would really like to transition him with rotigotine, but worry about the cost of the medication.  Certainly, his insurance is not going to pay for that.  I still may try it in the future if I have enough samples for transition.  -For now, I am going to have him decrease his pramipexole to 1.5 mg at approximately 6 to 7 PM and an additional 1.5 mg at bedtime for 3 weeks  -We will add gabapentin, 300 mg at  bedtime  -After 3 weeks, we will decrease the pramipexole so that he takes 0.75 mg at 6 to 7 PM and will continue the 1.5 mg at bedtime for another 3 weeks  -3 weeks after that, he will decrease the pramipexole so that he takes 0.75 mg at 6 to 7 PM and another 0.75 mg at bedtime.  -This is likely still a dose of pramipexole that is going to initiate augmentation, but it is at least to start.  We may need to raise the gabapentin as well.  He and I discussed that today.  3.  I will plan on seeing him back in about 3 months, at which time we can reassess the restless leg as well as the possibility of whether or not he has Parkinson's disease.  Patient was agreeable.   Subjective:   Stephen Best was seen today in follow-up for previously diagnosed Parkinson's disease, that was longstanding.  Last visit, patient did not look particularly parkinsonian to me and I wanted to see him off of his medication to see how he looked on both of levodopa and pramipexole.  Unfortunately, he took it today.    Last visit, I was also concerned with the amount of pramipexole that he was taking for restless leg (takes 3.75 mg at bedtime).  This  is a very large dose, especially given in 1 dosage, but also was concerned about augmentation associated with the medication.  Discussed with the patient that this needs to be weaned back significantly.  PREVIOUS MEDICATIONS: requip (for rls)  ALLERGIES:   Allergies  Allergen Reactions   Lisinopril     Other reaction(s): cough    CURRENT MEDICATIONS:  No outpatient medications have been marked as taking for the 07/21/21 encounter (Office Visit) with Jaelyn Cloninger, Octaviano Batty, DO.     Objective:   VITALS:   Vitals:   07/21/21 1424  BP: 124/83  Pulse: 73  SpO2: 93%  Weight: 243 lb 9.6 oz (110.5 kg)  Height: 5\' 5"  (1.651 m)     GEN:  The patient appears stated age and is in NAD. HEENT:  Normocephalic, atraumatic.   Neurological examination:  Orientation: The patient is  alert and oriented x3.  Cranial nerves: There is good facial symmetry.  There is no significant facial hypomimia.  He has some periorbital edema around the right eye that he states is chronic.  Extraocular muscles are intact. The speech is fluent and clear. Hearing is intact to conversational tone. Motor: Strength is 5/5 in the bilateral upper extremities and at least antigravity in the lower extremities.   Movement examination: Tone: There is normal tone in the bilateral upper extremities.  The tone in the lower extremities is normal.  Abnormal movements: None Coordination:  There is no decremation with RAM's, with any form of RAMS, including alternating supination and pronation of the forearm, hand opening and closing, finger taps, heel taps and toe taps. Gait and Station: The patient has no difficulty arising out of a deep-seated chair without the use of the hands. The patient's stride length is good.   I have reviewed and interpreted the following labs independently   Chemistry      Component Value Date/Time   NA 143 09/16/2016 1707   K 3.8 09/16/2016 1707   CL 98 09/16/2016 1707   CO2 29 09/16/2016 1707   BUN 10 09/16/2016 1707   CREATININE 0.94 09/16/2016 1707      Component Value Date/Time   CALCIUM 9.2 09/16/2016 1707   ALKPHOS 70 09/16/2016 1707   AST 16 09/16/2016 1707   ALT 21 09/16/2016 1707   BILITOT 0.4 09/16/2016 1707      Lab Results  Component Value Date   TSH 1.43 07/07/2021   Lab Results  Component Value Date   WBC 7.7 07/07/2021   HGB 14.6 07/07/2021   HCT 45.2 07/07/2021   MCV 82.7 07/07/2021   PLT 248.0 07/07/2021      Cc:  07/09/2021, MD

## 2021-07-21 ENCOUNTER — Encounter: Payer: Self-pay | Admitting: Neurology

## 2021-07-21 ENCOUNTER — Ambulatory Visit: Payer: Managed Care, Other (non HMO) | Admitting: Neurology

## 2021-07-21 ENCOUNTER — Other Ambulatory Visit: Payer: Self-pay

## 2021-07-21 VITALS — BP 124/83 | HR 73 | Ht 65.0 in | Wt 243.6 lb

## 2021-07-21 DIAGNOSIS — G2581 Restless legs syndrome: Secondary | ICD-10-CM

## 2021-07-21 MED ORDER — PRAMIPEXOLE DIHYDROCHLORIDE 0.75 MG PO TABS: ORAL_TABLET | ORAL | 3 refills | Status: DC

## 2021-07-21 MED ORDER — GABAPENTIN 300 MG PO CAPS: 300.0000 mg | ORAL_CAPSULE | Freq: Every day | ORAL | 3 refills | Status: DC

## 2021-07-21 NOTE — Patient Instructions (Addendum)
decrease pramipexole to 1.5 mg at approximately 6 to 7 PM and an additional 1.5 mg at bedtime for 3 weeks -add gabapentin, 300 mg at bedtime   2.  In 3 weeks, decrease the pramipexole so that you take 0.75 mg at 6 to 7 PM and will continue the 1.5 mg at bedtime for another 3 weeks  3. In 6 weeks (3 weeks after instruction number 2),  decrease the pramipexole so that you take 0.75 mg at 6 to 7 PM and another 0.75 mg at bedtime.  As we discussed, pramipexole at the dosages you were at cause augmentation and can promote restless leg.  Decreasing the medication can be hard for a little while but restless leg doesn't get better without working on this.  Even the dose that I left you at is too high and we will need to look at this further in the future.  When you come back, hold the pramipexole on may 7 and 8 and carbidopa/levodopa on may 8.  I will see you on 5/9.

## 2021-07-28 ENCOUNTER — Encounter: Payer: Self-pay | Admitting: Neurology

## 2021-08-04 ENCOUNTER — Encounter: Payer: Self-pay | Admitting: Neurology

## 2021-08-05 ENCOUNTER — Ambulatory Visit: Payer: Managed Care, Other (non HMO) | Admitting: Neurology

## 2021-08-07 ENCOUNTER — Telehealth: Payer: Self-pay | Admitting: Neurology

## 2021-08-07 ENCOUNTER — Other Ambulatory Visit: Payer: Self-pay | Admitting: Neurology

## 2021-08-07 DIAGNOSIS — G2581 Restless legs syndrome: Secondary | ICD-10-CM

## 2021-08-07 NOTE — Telephone Encounter (Signed)
Patient called and stated that his gabapentin is running out faster because of a change in dosage.  The pharmacy asked him to call here and let the staff know.  He called right back and said that it was fixed from our end. ?

## 2021-08-22 ENCOUNTER — Other Ambulatory Visit: Payer: Self-pay | Admitting: Neurology

## 2021-08-22 ENCOUNTER — Other Ambulatory Visit: Payer: Self-pay

## 2021-08-22 DIAGNOSIS — G2581 Restless legs syndrome: Secondary | ICD-10-CM

## 2021-10-10 NOTE — Progress Notes (Signed)
? ? ?Assessment/Plan:  ? ? ?1.  Parkinsons Disease , by hx ? -He still does not look particularly parkinsonian.  He was asked last 2 visits to coming off of levodopa so that I could see him when medication was potentially not covering up symptoms.  We actually called him and sent him a MyChart message yesterday to remind him of this, but unfortunately he came back in on medication again today.  Again, I do not see any evidence of Parkinson's disease or any neurodegenerative movement disorder, but this potentially could be masked by medication.  However, he is only taking his medication twice daily, the last at bedtime, and I really would not think that this would help so significantly. ? -We previously discussed DaTscan.  We previously had decided to hold on it, but decided to proceed with that today as the patient is noting more tremor. ? -We discussed that the new skin biopsy for alpha-synuclein hold on that for now (may also be good to hold until he hits [facility]) ? -He did have a neurologic opinion early on back in 2015 at diagnosis at [facility].  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  Bradykinesia really is required for the diagnosis of Parkinson's disease, but again would be more helpful to see him off of medication completely.  I believe that he was on pramipexole when he was seen by [facility] as well.  In addition, he no longer has blepharospasm and hemifacial spasm. ? ?2.  Restless leg ? -We have been weaning the patient's pramipexole.  He was on a very large dose, and we have been trying to decrease that because of augmentation. ? -This is likely still a dose of pramipexole that is going to initiate augmentation, but it is at least to start. ? -add ultram 50 mg at bed.  We discussed risks, benefits, and side effects.  Discussed that this is a controlled substance.  Discussed that this is potentially addictive.   Discussed not to take with CBD/THC Gummies. ? -continue pramipexole 0.75 mg bid after dinner and at bed ? -just do gabapentin, 300 mg, at bed (he found that 300 mg bid made him cog dull).   ? ?3.  Patient and wife with multiple questions.  Patient's wife not previously here.  I answered those to the best of my ability today. ? ? ?Subjective:  ? ?Stephen Best was seen today in follow-up for previously diagnosed Parkinson's disease, that was longstanding.  I have not been convinced about that diagnosis. Pt with wife who supplements the hx.   Last visit, I wanted him to hold his levodopa, but he had forgotten and came in on medicine.  We told him to hold it for today's visit.  He forgot.  States "I think that I have it."  He has trouble writing.  Has trouble eating and worsening balance with eyes shut.  Was kayaking this weekend and had tremor.   ? ?Separately, I did decrease his pramipexole last visit that he was on for restless leg.  He had started on a really large dose (3.75 mg at bedtime).  We weaned him all the way down to 0.75 mg twice per day.  I did add gabapentin, 300 mg, 1 at dinner and 1 at bedtime.  Patient emailed me 2 weeks later stating that he was concerned about restless leg symptoms and was concerned that he would start wandering or  being thrown out of the bed.  We discussed the difference between restless leg and REM behavior disorder.  We did offer him a trial of rotigotine, but felt that insurance likely would not pay for it.  He ultimately decided to hold off on that.  He is sleeping better but feels that the gabapentin sedates him.  He feels that the rls sx's are earlier in the evening.   ? ?Noting some word finding trouble.  Few weeks ago he forgot the name of [place].   ? ?Current movement disorder medications: ?Pramipexole, 0.75 mg twice per day ?Carbidopa/levodopa 25/250, 1 tablet 3 times per day (he reports taking in AM and bed only) ?Gabapentin, 300 mg, 1 tablet at dinner and 1 tablet at  bedtime ? ?PREVIOUS MEDICATIONS: requip (for rls) ? ?ALLERGIES:   ?Allergies  ?Allergen Reactions  ? Lisinopril   ?  Other reaction(s): cough  ? ? ?CURRENT MEDICATIONS:  ?Current Meds  ?Medication Sig  ? ALPRAZolam (XANAX) 0.5 MG tablet Take 1 tablet (0.5 mg total) by mouth 3 (three) times daily as needed for anxiety. (Patient taking differently: Take 0.5 mg by mouth 3 (three) times daily as needed for anxiety. For RLS while in flight)  ? carbidopa-levodopa (SINEMET IR) 25-250 MG tablet TAKE 1 TABLET BY MOUTH THREE TIMES A DAY  ? cholecalciferol (VITAMIN D) 1000 UNITS tablet Take 2,000 Units by mouth daily.  ? COVID-19 mRNA bivalent vaccine, Pfizer, (PFIZER COVID-19 VAC BIVALENT) injection Inject into the muscle.  ? gabapentin (NEURONTIN) 300 MG capsule Take 1 capsule (300 mg total) by mouth 2 (two) times daily. TAKE 1 WITH DINNER AND TAKE 1 AT BEDTIME  ? Omeprazole Magnesium (PRILOSEC OTC PO) Take 20 mg by mouth daily.  ? pramipexole (MIRAPEX) 0.75 MG tablet 1 at 6pm, 1 at bed  ? pramipexole (MIRAPEX) 1.5 MG tablet TAKE 1 TABLET (1.5 MG TOTAL) BY MOUTH 3 (THREE) TIMES DAILY.  ? triamterene-hydrochlorothiazide (MAXZIDE-25) 37.5-25 MG tablet Take 1 tablet by mouth every morning.  ?  ? ?Objective:  ? ?VITALS:   ?Vitals:  ? 10/14/21 0838  ?BP: 124/76  ?Pulse: 78  ?SpO2: 98%  ?Weight: 246 lb (111.6 kg)  ?Height: 5\' 6"  (1.676 m)  ? ? ? ? ?GEN:  The patient appears stated age and is in NAD. ?HEENT:  Normocephalic, atraumatic.  ? ?Neurological examination: ? ?Orientation: The patient is alert and oriented x3.  ?Cranial nerves: There is good facial symmetry.  There is no significant facial hypomimia.  He has some periorbital edema around the right eye that he states is chronic.  Extraocular muscles are intact. The speech is fluent and clear. Hearing is intact to conversational tone. ?Motor: Strength is 5/5 in the bilateral upper extremities and at least antigravity in the lower extremities. ? ? ?Movement examination: ?Tone:  There is normal tone in the bilateral upper extremities.  The tone in the lower extremities is normal.  ?Abnormal movements: none seen but I can feel it a bit ?Coordination:  There is mild decremation with RAM's, with any form of RAMS, including alternating supination and pronation of the forearm, hand opening and closing, finger taps, heel taps and toe taps on the L ?Gait and Station: The patient has no difficulty arising out of a deep-seated chair without the use of the hands. The patient's stride length is good.   ?I have reviewed and interpreted the following labs independently ?  Chemistry   ?   ?Component Value Date/Time  ? NA 143 09/16/2016  1707  ? K 3.8 09/16/2016 1707  ? CL 98 09/16/2016 1707  ? CO2 29 09/16/2016 1707  ? BUN 10 09/16/2016 1707  ? CREATININE 0.94 09/16/2016 1707  ?    ?Component Value Date/Time  ? CALCIUM 9.2 09/16/2016 1707  ? ALKPHOS 70 09/16/2016 1707  ? AST 16 09/16/2016 1707  ? ALT 21 09/16/2016 1707  ? BILITOT 0.4 09/16/2016 1707  ?  ? ? ?Lab Results  ?Component Value Date  ? TSH 1.43 07/07/2021  ? ?Lab Results  ?Component Value Date  ? WBC 7.7 07/07/2021  ? HGB 14.6 07/07/2021  ? HCT 45.2 07/07/2021  ? MCV 82.7 07/07/2021  ? PLT 248.0 07/07/2021  ? ? ?Total time spent on today's visit was 40 minutes, including both face-to-face time and nonface-to-face time.  Time included that spent on review of records (prior notes available to me/labs/imaging if pertinent), discussing treatment and goals, answering patient's questions and coordinating care. ? ? ?Cc:  Renford DillsPolite, Ronald, MD ? ?

## 2021-10-13 ENCOUNTER — Telehealth: Payer: Self-pay | Admitting: Neurology

## 2021-10-13 NOTE — Telephone Encounter (Signed)
Please call patient and remind him to hold Parkinsons Disease meds for visit tomorrow (hold x 24 hours) ?

## 2021-10-13 NOTE — Telephone Encounter (Signed)
Called patient and sent my chart medication to hold Carbidopa levodopa and pramipexole for 24 hrs before appt tomorrow  ?

## 2021-10-14 ENCOUNTER — Ambulatory Visit (INDEPENDENT_AMBULATORY_CARE_PROVIDER_SITE_OTHER): Payer: Commercial Managed Care - HMO | Admitting: Neurology

## 2021-10-14 ENCOUNTER — Encounter: Payer: Self-pay | Admitting: Neurology

## 2021-10-14 VITALS — BP 124/76 | HR 78 | Ht 66.0 in | Wt 246.0 lb

## 2021-10-14 DIAGNOSIS — G2581 Restless legs syndrome: Secondary | ICD-10-CM | POA: Diagnosis not present

## 2021-10-14 DIAGNOSIS — G20 Parkinson's disease: Secondary | ICD-10-CM | POA: Diagnosis not present

## 2021-10-14 DIAGNOSIS — R251 Tremor, unspecified: Secondary | ICD-10-CM

## 2021-10-14 MED ORDER — GABAPENTIN 300 MG PO CAPS: 300.0000 mg | ORAL_CAPSULE | Freq: Every day | ORAL | 0 refills | Status: DC

## 2021-10-14 MED ORDER — TRAMADOL HCL 50 MG PO TABS: 50.0000 mg | ORAL_TABLET | Freq: Every day | ORAL | 0 refills | Status: DC

## 2021-10-14 NOTE — Patient Instructions (Signed)
Change gabapentin to at bedtime only ?Add tramadol 50 mg either at dinner or bed.  This is a controlled substance.   ? ?As we discussed, we are going to do a DaT scan.  We discussed that this is not a diagnostic scan, but will just give [place] some information on dopamine levels in the brain.  Here is some information which may be helpful to you. ? ?Before the Exam ? ?Please tell the nurse, nuclear imaging technician or nuclear medicine physician if you are pregnant, nursing or have reduced liver function. ?Please also inform us if you have an allergy or sensitivity to iodine.  The test may be completed with those who are allergic to iodine, but may require pre-medication with other medications to help avoid reactions. ?If you need to cancel the examination, please give [place] at least 24 hours notice. ? ?On the Day of the Exam ?Drink plenty of fluids and go to the bathroom frequently (and for two days after your exam) ?Wear loose comfortable clothing, since you will need to lie still for a period of time. ?Please bring a list of all medications that you are taking; name and dosage. ?We want to make your waiting time as pleasant as possible. Consider bringing your favorite magazine, book or music player to help you pass the time.  You do not need to stay at the imaging facility the entire time, between the initial injection and the scan itself.   ?Please leave your jewelry and valuables at home. ? ?During the Exam ?The DaTscan once started takes approximately 30-45 minutes. However, following injection of the DaT agent approximately 3-6 hours are required before the agent has achieved appropriate concentration in the brain.  We will inject the DaTscan through an intravenous (IV) line into your arm in the AM, usually around 8-9am, and then you will come back usually in the mid afternoon for the scan. ?Before the exam, you will receive a drug to allow you to protect the thyroid. ?For the imaging test, you will be asked to lie  on a table and an imaging technologist will position your head in a headrest. A strip of tape or a flexible restraint may be placed around your head to help you to not move your head during the scan. ?A camera will be positioned above you and you must remain very still for about 30 minute while images are taken. The scanner will be very close to your head, but will not touch your head. ? ?

## 2021-10-29 ENCOUNTER — Encounter (HOSPITAL_COMMUNITY)
Admission: RE | Admit: 2021-10-29 | Discharge: 2021-10-29 | Disposition: A | Discharge status: Home / Self Care | Payer: Commercial Managed Care - HMO | Source: Ambulatory Visit | Attending: Neurology | Admitting: Neurology

## 2021-10-29 ENCOUNTER — Ambulatory Visit (HOSPITAL_COMMUNITY)
Admission: RE | Admit: 2021-10-29 | Discharge: 2021-10-29 | Disposition: A | Discharge status: Home / Self Care | Payer: Commercial Managed Care - HMO | Source: Ambulatory Visit | Attending: Neurology | Admitting: Neurology

## 2021-10-29 DIAGNOSIS — R251 Tremor, unspecified: Secondary | ICD-10-CM | POA: Insufficient documentation

## 2021-10-29 MED ORDER — IOFLUPANE I 123 185 MBQ/2.5ML IV SOLN
4.5000 | Freq: Once | INTRAVENOUS | Status: AC | PRN
Start: 2021-10-29 — End: 2021-10-29
  Administered 2021-10-29: 4.5 via INTRAVENOUS
  Filled 2021-10-29: qty 5

## 2021-10-29 MED ORDER — POTASSIUM IODIDE (ANTIDOTE) 130 MG PO TABS
ORAL_TABLET | ORAL | Status: AC
  Administered 2021-10-29: 130 mg via ORAL
  Filled 2021-10-29: qty 1

## 2021-10-29 MED ORDER — POTASSIUM IODIDE (ANTIDOTE) 130 MG PO TABS: 130.0000 mg | ORAL_TABLET | Freq: Once | ORAL | Status: DC

## 2021-11-03 ENCOUNTER — Encounter: Payer: Self-pay | Admitting: Neurology

## 2021-11-04 ENCOUNTER — Telehealth: Payer: Self-pay

## 2021-11-04 DIAGNOSIS — G2581 Restless legs syndrome: Secondary | ICD-10-CM

## 2021-11-04 NOTE — Telephone Encounter (Signed)
Response from Dr. Carles Collet from patients mychart message:   Call patient and let him know:  1.  We can try and get him on cancellation list for appt to discuss Parkinsons Disease.  We have done this twice and he has come in on meds.  Have him hold meds 24 hours prior to appt  2.  Unfortunately, I am out of ideas for his rls and will need to refer him to a sleep specialist.  [facility] has not been seeing my patients just for RLS if they can't do a sleep study on the patient.  I would recommend [facility] neurology sleep medicine or [facility] neurology sleep medicine.     Called patient 11/04/2021 at 11:33 am and left a message for a call back.

## 2021-11-05 NOTE — Telephone Encounter (Signed)
Patient returned call to [facility], he left a voice mail.

## 2021-11-05 NOTE — Telephone Encounter (Signed)
Called patient and informed him of Dr. Don Perking recommendations in previous message. Patient would like to be placed on the cancellation list and is aware that he must stop his medication 24 hours prior to that.   Also informed patient that Dr. Arbutus Leas is out of ideas about his RLS and we will need to refer him to a sleep specialist. Patient is agreeable to have a referral sent and has requested we send his referral to [facility] sleep medicine.   Before hanging up I reminded patient once again that if he gets a call for a sooner appt from the cancellation list he must hold his medication for 24 hours prior to his scheduled appointment. Patient verbalized understanding.

## 2021-11-05 NOTE — Telephone Encounter (Signed)
Pt is added to the wait list.

## 2021-11-13 ENCOUNTER — Other Ambulatory Visit: Payer: Self-pay | Admitting: Neurology

## 2021-11-13 DIAGNOSIS — G2581 Restless legs syndrome: Secondary | ICD-10-CM

## 2022-02-16 ENCOUNTER — Other Ambulatory Visit: Payer: Self-pay | Admitting: Neurology

## 2022-02-16 DIAGNOSIS — G2581 Restless legs syndrome: Secondary | ICD-10-CM

## 2022-03-13 NOTE — Progress Notes (Signed)
Assessment/Plan:    1.  Parkinsons Disease , by hx  -I was able to see him off of the carbidopa/levodopa and pramipexole and he did look mildly parkinsonian with sx's on R; correlates with DaT scan  -His DaTscan does demonstrate loss of dopamine on the left.  -We discussed that the new skin biopsy for alpha-synuclein hold on that for now (may also be good to hold until he hits [facility])  2.  Restless leg  -This has been very refractory to treatment.  Unfortunately, we really have been struggling with augmentation.  He came to me on a very large dose of pramipexole (3.75 mg at bedtime) and I have weaned that down to 0.75 mg twice per day, but the patient continues to have symptoms.    -He found Ultram ineffective.  -He found higher dosages of gabapentin with cognitive dulling.  He is currently on gabapentin, 300 mg after dinner and at bed  -add klonopin, 0.5 mg, 1/2 po q hs.  Discussed r/b/se  -We referred him to [facility] neurology sleep medicine, but he has been unable to get an appointment thus far.   Subjective:   Stephen Best was seen today in follow-up for previously diagnosed Parkinson's disease, that was longstanding.  I previously had not been convinced about the diagnosis, but also recognize that his medications could have been covering up symptoms.  I had asked him for the last several visits to hold his medicine before he came, but he had forgotten each time.  We ended up doing a DaTscan on May 24.  There was loss of dopamine on the left, and relatively maintained dopamine on the right.  He has been on levodopa, but really has only been taking 1 in the morning and the other at bedtime (which generally is not that effective for Parkinsons).  He does state that he held the medication today.  He last took it at 11pm last night (seen at 9am).  Had worse RLS night and felt more jittery.  No falls.  He did just have surgery on the L foot.  He is off of oxycodone.    Last visit, we added tramadol  at bedtime for his restless leg symptoms.  He only took it for a few days (never got a prescription for more than 7 pills) and stopped it as he did not think it was helpful.  He emailed me back and he was frustrated with the restless leg symptoms.  We recommended [facility] neurology sleep medicine.  Current movement disorder medications: Pramipexole, 0.75 mg twice per day (dinner and bed) Carbidopa/levodopa 25/250, 1 tablet 3 times per day (he reports taking in AM and bed only) Gabapentin, 300 mg, at dinner and at bed  PREVIOUS MEDICATIONS: requip (for rls); pramipexole (used to be on 3.75 mg q hs just for rls); ultram; xanax  ALLERGIES:   Allergies  Allergen Reactions   Lisinopril     Other reaction(s): cough   Other Other (See Comments)    CURRENT MEDICATIONS:  Current Meds  Medication Sig   carbidopa-levodopa (SINEMET IR) 25-250 MG tablet TAKE 1 TABLET BY MOUTH THREE TIMES A DAY   cholecalciferol (VITAMIN D) 1000 UNITS tablet Take 2,000 Units by mouth daily.   gabapentin (NEURONTIN) 300 MG capsule TAKE 1 CAPSULE 2 (TWO) TIMES DAILY WITH DINNER AND AT BEDTIME.   Omeprazole Magnesium (PRILOSEC OTC PO) Take 20 mg by mouth daily.   pramipexole (MIRAPEX) 1.5 MG tablet TAKE 1 TABLET (1.5 MG TOTAL)  BY MOUTH 3 (THREE) TIMES DAILY.   triamterene-hydrochlorothiazide (MAXZIDE-25) 37.5-25 MG tablet Take 1 tablet by mouth every morning.     Objective:   VITALS:   Vitals:   03/17/22 0905  BP: (!) 140/90  Pulse: 88  Resp: 20  SpO2: 95%  Weight: 247 lb (112 kg)  Height: 5\' 6"  (1.676 m)       GEN:  The patient appears stated age and is in NAD. HEENT:  Normocephalic, atraumatic.   Neurological examination:  Orientation: The patient is alert and oriented x3.  Cranial nerves: There is good facial symmetry.  There is no significant facial hypomimia.  He has some periorbital edema around the right eye that he states is chronic.  Extraocular muscles are intact. The speech is fluent and clear.  Hearing is intact to conversational tone. Motor: Strength is 5/5 in the bilateral upper extremities and at least antigravity in the lower extremities.   Movement examination: Tone: There is normal tone in the bilateral upper extremities.  The tone in the lower extremities is normal.  Abnormal movements: rare tremor of the R thumb Coordination:  There is mild decremation on the R Gait and Station: The patient has no difficulty arising out of a deep-seated chair without the use of the hands. The patient's stride length is decreased with decreased arm swing on the R.   I have reviewed and interpreted the following labs independently   Chemistry      Component Value Date/Time   NA 143 09/16/2016 1707   K 3.8 09/16/2016 1707   CL 98 09/16/2016 1707   CO2 29 09/16/2016 1707   BUN 10 09/16/2016 1707   CREATININE 0.94 09/16/2016 1707      Component Value Date/Time   CALCIUM 9.2 09/16/2016 1707   ALKPHOS 70 09/16/2016 1707   AST 16 09/16/2016 1707   ALT 21 09/16/2016 1707   BILITOT 0.4 09/16/2016 1707      Lab Results  Component Value Date   TSH 1.43 07/07/2021   Lab Results  Component Value Date   WBC 7.7 07/07/2021   HGB 14.6 07/07/2021   HCT 45.2 07/07/2021   MCV 82.7 07/07/2021   PLT 248.0 07/07/2021    Total time spent on today's visit was 22 minutes, including both face-to-face time and nonface-to-face time.  Time included that spent on review of records (prior notes available to me/labs/imaging if pertinent), discussing treatment and goals, answering patient's questions and coordinating care.   Cc:  Seward Carol, MD

## 2022-03-17 ENCOUNTER — Other Ambulatory Visit: Payer: Self-pay

## 2022-03-17 ENCOUNTER — Ambulatory Visit: Payer: Commercial Managed Care - HMO | Admitting: Neurology

## 2022-03-17 ENCOUNTER — Encounter: Payer: Self-pay | Admitting: Neurology

## 2022-03-17 VITALS — BP 140/90 | HR 88 | Resp 20 | Ht 66.0 in | Wt 247.0 lb

## 2022-03-17 DIAGNOSIS — G20A1 Parkinson's disease without dyskinesia, without mention of fluctuations: Secondary | ICD-10-CM

## 2022-03-17 DIAGNOSIS — G2581 Restless legs syndrome: Secondary | ICD-10-CM

## 2022-03-17 MED ORDER — CLONAZEPAM 0.5 MG PO TABS: 0.2500 mg | ORAL_TABLET | Freq: Every day | ORAL | 1 refills | Status: DC

## 2022-03-17 NOTE — Patient Instructions (Addendum)
Make sure that you take the middle of the day dose of carbidopa/levodopa in every day  Start klonopin, 0.5 mg, 1/2 tablet 30 min before bedtime  Local and Online Resources for Power over Parkinson's Group  October 2023    LOCAL [place] PARKINSON'S GROUPS   Power over Parkinson's Group:    Power Over Parkinson's Patient Education Group will be Wednesday, October 11th-*Hybrid meting*- in person at [place] location and via [facility] at 2 pm.   Upcoming Power over [employer] Meetings:  2nd Wednesdays of the month at 2 pm:   October 11th, November 8th, December 13th  Contact Amy Marriott at [email or web address] if interested in participating in this group    [place]! Moves [employer] Instructor-Led Classes offering at [employer]!  TUESDAYS and Wednesdays 1-2 pm.   Contact Vonna Kotyk at  [email or web address] or Caron Presume at [place], [email or web address]  Dance for Parkinson 's classes will be on Tuesdays 9:30am-10:30am starting October 3-December 12 with a break the week of November 21st. Located in the December 04 which is in the first floor of the [employer]  ([address] To register:  [email or web address] or [phone]  Drumming for Parkinson's will be held on 2nd and 4th Mondays at 11:00 am.   Located at the [place] ([place]  Shafer at [email or web address] or [phone]  Through support from the [place] for Parkinson's classes are free for both patients and caregivers.    [employer] Parkinson's Tai Chi Class, Mondays at 11 am.  Call [phone] for details   [place]:  [email or web address]  PD Health at Home continues:  Mindfulness Mondays, Wellness Wednesdays, Fitness Fridays   Upcoming Education:     Parkinson's 101:  What you and your family should know.  Wednesday, Oct. 4th 1-2 pm  Expert Briefing:     Parkinson's and the Gut-Brain Connection.  Wednesday, Oct. 11th 1-2 pm  Hallucinations and Delusions in Parkinson's.  Wednesday, Nov. 8th, 1-2 pm  Register for expert briefings (webinars) at 10-10-1984  Please check out their website to sign up for emails and see their full online offerings      [place]:  [email or web address]   Third Thursday Webinars:  On the third Thursday of every month at 12 p.m. ET, join our free live webinars to learn about various aspects of living with Parkinson's disease and our work to speed medical breakthroughs.  Upcoming Webinar:  Friday for Surveyor, mining. (Replay).  Thursday, Oct. 12th at 12 noon  Check out additional information on their website to see their full online offerings    [place]:  [email or web address]  Upcoming Webinar:   Stay tuned  Webinar Series:  Living with Parkinson's Meetup.   Third Thursdays each month, 3 pm  Care Partner Monthly Meetup.  With 07-23-1983 [place].  First Tuesday of each month, 2 pm  Check out additional information to Live Well Today on their website    Parkinson and Movement Disorders (PMD) Alliance:  www.[email or web address]  [employer] Online:  Online Education Events  Sign up for emails, which are sent weekly to give you updates on programming and online offerings    Parkinson's Association of the [place]:  www.[email or web address]  Information on online support groups, education events, and online exercises including Yoga, Parkinson's exercises and more-LOTS of information on  links to PD resources and online events  Virtual Support Group through Parkinson's Association of the [place]; next one is scheduled for Wednesday, October 4th at 2 pm.  (These are  typically scheduled for the 1st Wednesday of the month at 2 pm).  Visit website for details.   MOVEMENT AND EXERCISE OPPORTUNITIES  PWR! Moves Classes at [place].  Wednesdays 10 and 11 am.   Contact Amy Marriott, PT [email or web address] if interested.  NEW PWR! Moves Class offerings at [employer].  *TUESDAYS* and Wednesdays 1-2 pm.  Contact Vonna Kotyk at  [email or web address] or Caron Presume at [place],  [email or web address]  Parkinson's Wellness Recovery (PWR! Moves)  www.[email or web address]  Info on the PWR! Virtual Experience:  You will have access to our expertise?through self-assessment, guided plans that start with the PD-specific fundamentals, educational content, tips, Q&A with an expert, and a growing [employer] of PD-specific pre-recorded and live exercise classes of varying types and intensity - both physical and cognitive! If that is not enough, we offer 1:1 wellness consultations (in-person or virtual) to personalize your PWR! Art therapist.   [place] Fridays:   As part of the PD Health @ Home program, this free video series focuses each week on one aspect of fitness designed to support people living with Parkinson's.? These weekly videos highlight the [place] fitness guidelines for people with Parkinson's disease.  [address]  Dance for PD website is offering free, live-stream classes throughout the week, as well as links to ModemGamers.si of classes:  [email or web address]  Virtual dance and Pilates for Parkinson's classes: Click on the Community Tab> Parkinson's Movement Initiative Tab.  To register for classes and for more information, visit www.[employer] and click the "community" tab.   [employer] Parkinson's Cycling Classes   [facility] [employer]:  Thursdays @ Noon-Live classes at 07-23-1983 ([employer] at  [email or web address]?or [phone])  [facility] [employer]: Virtual Classes Mondays and Thursdays 07-23-1983 classes Tuesday, Wednesday and Thursday (contact [place] at [email or web address] ?or [phone])  [place]  Varied levels of classes are offered Tuesdays and Thursdays at 07-23-1983.   Stretching with [employer] weekly class is also offered for people with Parkinson's  To observe a class or for more information, call [phone] or email [phone] at [email or web address]   ADDITIONAL SUPPORT AND RESOURCES  [employer]:Online Caregiver Education Opportunities:  www.well-springsolutions.org/caregiver-education/caregiver-support-group.  You may also contact Hezzie Bump at [email or web address] or [phone].     Well[employer]Spring Navigator:  Just1Navigator program, a?free service to help individuals and families through the journey of determining care for older adults.  The "Navigator" is a 08-08-1986, [phone], who will speak with a prospective client and/or loved ones to provide an assessment of the situation and a set of recommendations for a personalized care plan -- all free of charge, and whether?[employer] offers the needed service or not. If the need is not a service we provide, we are well-connected with reputable programs in town that we can refer you to.  www.well-springsolutions.org or to speak with the Navigator, call [phone].

## 2022-04-15 ENCOUNTER — Other Ambulatory Visit (HOSPITAL_COMMUNITY): Payer: Self-pay | Admitting: Orthopedic Surgery

## 2022-04-16 ENCOUNTER — Other Ambulatory Visit: Payer: Self-pay

## 2022-04-16 ENCOUNTER — Encounter (HOSPITAL_BASED_OUTPATIENT_CLINIC_OR_DEPARTMENT_OTHER): Payer: Self-pay | Admitting: Orthopedic Surgery

## 2022-04-17 ENCOUNTER — Encounter (HOSPITAL_BASED_OUTPATIENT_CLINIC_OR_DEPARTMENT_OTHER)
Admission: RE | Admit: 2022-04-17 | Discharge: 2022-04-17 | Disposition: A | Discharge status: Home / Self Care | Payer: Commercial Managed Care - HMO | Source: Ambulatory Visit | Attending: Orthopedic Surgery | Admitting: Orthopedic Surgery

## 2022-04-17 DIAGNOSIS — Z01818 Encounter for other preprocedural examination: Secondary | ICD-10-CM | POA: Diagnosis present

## 2022-04-17 DIAGNOSIS — I10 Essential (primary) hypertension: Secondary | ICD-10-CM | POA: Diagnosis not present

## 2022-04-17 DIAGNOSIS — Z79899 Other long term (current) drug therapy: Secondary | ICD-10-CM | POA: Diagnosis not present

## 2022-04-17 LAB — BASIC METABOLIC PANEL
Anion gap: 8 (ref 5–15)
BUN: 10 mg/dL (ref 8–23)
CO2: 26 mmol/L (ref 22–32)
Calcium: 9.4 mg/dL (ref 8.9–10.3)
Chloride: 105 mmol/L (ref 98–111)
Creatinine, Ser: 0.95 mg/dL (ref 0.61–1.24)
GFR, Estimated: >60 mL/min (ref >60)
Glucose, Bld: 114 mg/dL — ABNORMAL HIGH (ref 70–99)
Potassium: 4.5 mmol/L (ref 3.5–5.1)
Sodium: 139 mmol/L (ref 135–145)

## 2022-04-17 NOTE — Progress Notes (Signed)

## 2022-04-20 NOTE — Progress Notes (Signed)
Patient called and stated he has Covid. Instructed patient to also call Dr Hewitt's office to notify. Informed patient that he could be rescheduled for his surgery 11 days after testing positive for Covid, provided he was without symptoms.Patient confirmed that he would call [facility] office.

## 2022-04-23 DIAGNOSIS — Z01818 Encounter for other preprocedural examination: Secondary | ICD-10-CM

## 2022-04-23 DIAGNOSIS — I10 Essential (primary) hypertension: Secondary | ICD-10-CM

## 2022-04-23 DIAGNOSIS — Z79899 Other long term (current) drug therapy: Secondary | ICD-10-CM

## 2022-04-27 ENCOUNTER — Other Ambulatory Visit (HOSPITAL_COMMUNITY): Payer: Self-pay | Admitting: Orthopedic Surgery

## 2022-05-21 ENCOUNTER — Encounter (HOSPITAL_BASED_OUTPATIENT_CLINIC_OR_DEPARTMENT_OTHER): Payer: Self-pay | Admitting: Orthopedic Surgery

## 2022-05-22 ENCOUNTER — Encounter (HOSPITAL_BASED_OUTPATIENT_CLINIC_OR_DEPARTMENT_OTHER)
Admission: RE | Admit: 2022-05-22 | Discharge: 2022-05-22 | Disposition: A | Discharge status: Home / Self Care | Payer: Commercial Managed Care - HMO | Source: Ambulatory Visit | Attending: Orthopedic Surgery | Admitting: Orthopedic Surgery

## 2022-05-22 DIAGNOSIS — Z01812 Encounter for preprocedural laboratory examination: Secondary | ICD-10-CM | POA: Insufficient documentation

## 2022-05-22 NOTE — Progress Notes (Signed)

## 2022-05-23 ENCOUNTER — Other Ambulatory Visit: Payer: Self-pay | Admitting: Neurology

## 2022-05-23 DIAGNOSIS — G2581 Restless legs syndrome: Secondary | ICD-10-CM

## 2022-05-27 NOTE — Anesthesia Preprocedure Evaluation (Signed)
Anesthesia Evaluation  Patient identified by MRN, date of birth, ID band Patient awake    Reviewed: Allergy & Precautions, NPO status , Patient's Chart, lab work & pertinent test results  Airway Mallampati: III  TM Distance: >3 FB Neck ROM: Full    Dental  (+) Teeth Intact, Dental Advisory Given   Pulmonary sleep apnea    Pulmonary exam normal breath sounds clear to auscultation       Cardiovascular hypertension, Pt. on medications Normal cardiovascular exam Rhythm:Regular Rate:Normal     Neuro/Psych parkinson's  Neuromuscular disease  negative psych ROS   GI/Hepatic Neg liver ROS,GERD  Medicated and Controlled,,  Endo/Other  negative endocrine ROS    Renal/GU negative Renal ROS  negative genitourinary   Musculoskeletal  (+) Arthritis , Osteoarthritis,    Abdominal   Peds  Hematology negative hematology ROS (+)   Anesthesia Other Findings   Reproductive/Obstetrics negative OB ROS                             Anesthesia Physical Anesthesia Plan  ASA: 2  Anesthesia Plan: General and Regional   Post-op Pain Management: Regional block* and Tylenol PO (pre-op)*   Induction: Intravenous  PONV Risk Score and Plan: 2 and Ondansetron, Dexamethasone, Treatment may vary due to age or medical condition and Midazolam  Airway Management Planned: LMA  Additional Equipment: None  Intra-op Plan:   Post-operative Plan: Extubation in OR  Informed Consent: I have reviewed the patients History and Physical, chart, labs and discussed the procedure including the risks, benefits and alternatives for the proposed anesthesia with the patient or authorized representative who has indicated his/her understanding and acceptance.     Dental advisory given  Plan Discussed with: CRNA  Anesthesia Plan Comments:         Anesthesia Quick Evaluation

## 2022-05-28 ENCOUNTER — Ambulatory Visit (HOSPITAL_BASED_OUTPATIENT_CLINIC_OR_DEPARTMENT_OTHER): Payer: Commercial Managed Care - HMO

## 2022-05-28 ENCOUNTER — Encounter (HOSPITAL_BASED_OUTPATIENT_CLINIC_OR_DEPARTMENT_OTHER)
Admission: RE | Disposition: A | Discharge status: Home / Self Care | Payer: Self-pay | Source: Home / Self Care | Attending: Orthopedic Surgery

## 2022-05-28 ENCOUNTER — Encounter (HOSPITAL_BASED_OUTPATIENT_CLINIC_OR_DEPARTMENT_OTHER): Payer: Self-pay | Admitting: Orthopedic Surgery

## 2022-05-28 ENCOUNTER — Ambulatory Visit (HOSPITAL_BASED_OUTPATIENT_CLINIC_OR_DEPARTMENT_OTHER): Payer: Commercial Managed Care - HMO | Admitting: Anesthesiology

## 2022-05-28 ENCOUNTER — Ambulatory Visit (HOSPITAL_BASED_OUTPATIENT_CLINIC_OR_DEPARTMENT_OTHER)
Admission: RE | Admit: 2022-05-28 | Discharge: 2022-05-28 | Disposition: A | Discharge status: Home / Self Care | Payer: Commercial Managed Care - HMO | Attending: Orthopedic Surgery | Admitting: Orthopedic Surgery

## 2022-05-28 DIAGNOSIS — Y793 Surgical instruments, materials and orthopedic devices (including sutures) associated with adverse incidents: Secondary | ICD-10-CM | POA: Insufficient documentation

## 2022-05-28 DIAGNOSIS — I10 Essential (primary) hypertension: Secondary | ICD-10-CM | POA: Insufficient documentation

## 2022-05-28 DIAGNOSIS — G2581 Restless legs syndrome: Secondary | ICD-10-CM | POA: Diagnosis not present

## 2022-05-28 DIAGNOSIS — T8484XA Pain due to internal orthopedic prosthetic devices, implants and grafts, initial encounter: Secondary | ICD-10-CM | POA: Insufficient documentation

## 2022-05-28 DIAGNOSIS — M960 Pseudarthrosis after fusion or arthrodesis: Secondary | ICD-10-CM | POA: Diagnosis not present

## 2022-05-28 DIAGNOSIS — G20A1 Parkinson's disease without dyskinesia, without mention of fluctuations: Secondary | ICD-10-CM | POA: Diagnosis not present

## 2022-05-28 DIAGNOSIS — K219 Gastro-esophageal reflux disease without esophagitis: Secondary | ICD-10-CM | POA: Insufficient documentation

## 2022-05-28 DIAGNOSIS — G629 Polyneuropathy, unspecified: Secondary | ICD-10-CM | POA: Diagnosis not present

## 2022-05-28 DIAGNOSIS — Y831 Surgical operation with implant of artificial internal device as the cause of abnormal reaction of the patient, or of later complication, without mention of misadventure at the time of the procedure: Secondary | ICD-10-CM | POA: Insufficient documentation

## 2022-05-28 DIAGNOSIS — Z969 Presence of functional implant, unspecified: Secondary | ICD-10-CM

## 2022-05-28 DIAGNOSIS — G4730 Sleep apnea, unspecified: Secondary | ICD-10-CM | POA: Diagnosis not present

## 2022-05-28 DIAGNOSIS — Z79899 Other long term (current) drug therapy: Secondary | ICD-10-CM

## 2022-05-28 DIAGNOSIS — Z01818 Encounter for other preprocedural examination: Secondary | ICD-10-CM

## 2022-05-28 HISTORY — PX: ARTHRODESIS METATARSALPHALANGEAL JOINT (MTPJ): SHX6566

## 2022-05-28 HISTORY — PX: HARDWARE REMOVAL: SHX979

## 2022-05-28 HISTORY — DX: Sleep apnea, unspecified: G47.30

## 2022-05-28 SURGERY — REMOVAL, HARDWARE
Anesthesia: Regional | Site: Foot | Laterality: Left

## 2022-05-28 SURGERY — REMOVAL, HARDWARE
Anesthesia: General | Site: Toe | Laterality: Left

## 2022-05-28 MED ORDER — LIDOCAINE 2% (20 MG/ML) 5 ML SYRINGE
INTRAMUSCULAR | Status: DC | PRN
  Administered 2022-05-28: 100 mg via INTRAVENOUS

## 2022-05-28 MED ORDER — ONDANSETRON HCL 4 MG/2ML IJ SOLN
INTRAMUSCULAR | Status: AC
  Filled 2022-05-28: qty 2

## 2022-05-28 MED ORDER — OXYCODONE HCL 5 MG PO TABS: 5.0000 mg | ORAL_TABLET | ORAL | 0 refills | Status: AC | PRN

## 2022-05-28 MED ORDER — ACETAMINOPHEN 500 MG PO TABS
ORAL_TABLET | ORAL | Status: AC
  Filled 2022-05-28: qty 2

## 2022-05-28 MED ORDER — AMISULPRIDE (ANTIEMETIC) 5 MG/2ML IV SOLN: 10.0000 mg | Freq: Once | INTRAVENOUS | Status: DC | PRN

## 2022-05-28 MED ORDER — DEXAMETHASONE SODIUM PHOSPHATE 10 MG/ML IJ SOLN
INTRAMUSCULAR | Status: DC | PRN
  Administered 2022-05-28: 5 mg via INTRAVENOUS

## 2022-05-28 MED ORDER — EPHEDRINE SULFATE (PRESSORS) 50 MG/ML IJ SOLN
INTRAMUSCULAR | Status: DC | PRN
  Administered 2022-05-28: 10 mg via INTRAVENOUS

## 2022-05-28 MED ORDER — ONDANSETRON HCL 4 MG/2ML IJ SOLN
INTRAMUSCULAR | Status: DC | PRN
  Administered 2022-05-28: 4 mg via INTRAVENOUS

## 2022-05-28 MED ORDER — HYDROMORPHONE HCL 1 MG/ML IJ SOLN: 0.2500 mg | INTRAMUSCULAR | Status: DC | PRN

## 2022-05-28 MED ORDER — DOCUSATE SODIUM 100 MG PO CAPS: 100.0000 mg | ORAL_CAPSULE | Freq: Every day | ORAL | 2 refills | Status: DC | PRN

## 2022-05-28 MED ORDER — CEFAZOLIN SODIUM-DEXTROSE 2-4 GM/100ML-% IV SOLN
2.0000 g | INTRAVENOUS | Status: AC
  Administered 2022-05-28: 2 g via INTRAVENOUS

## 2022-05-28 MED ORDER — 0.9 % SODIUM CHLORIDE (POUR BTL) OPTIME
TOPICAL | Status: DC | PRN
  Administered 2022-05-28: 200 mL

## 2022-05-28 MED ORDER — FENTANYL CITRATE (PF) 100 MCG/2ML IJ SOLN
100.0000 ug | Freq: Once | INTRAMUSCULAR | Status: AC
  Administered 2022-05-28: 100 ug via INTRAVENOUS

## 2022-05-28 MED ORDER — ACETAMINOPHEN 500 MG PO TABS
1000.0000 mg | ORAL_TABLET | Freq: Once | ORAL | Status: AC
  Administered 2022-05-28: 1000 mg via ORAL

## 2022-05-28 MED ORDER — LACTATED RINGERS IV SOLN: INTRAVENOUS | Status: DC

## 2022-05-28 MED ORDER — OXYCODONE HCL 5 MG PO TABS: 5.0000 mg | ORAL_TABLET | Freq: Once | ORAL | Status: DC | PRN

## 2022-05-28 MED ORDER — ONDANSETRON HCL 4 MG/2ML IJ SOLN: 4.0000 mg | Freq: Once | INTRAMUSCULAR | Status: DC | PRN

## 2022-05-28 MED ORDER — VANCOMYCIN HCL 500 MG IV SOLR
INTRAVENOUS | Status: AC
  Filled 2022-05-28: qty 10

## 2022-05-28 MED ORDER — SENNA 8.6 MG PO TABS: 2.0000 | ORAL_TABLET | Freq: Two times a day (BID) | ORAL | 0 refills | Status: DC

## 2022-05-28 MED ORDER — FENTANYL CITRATE (PF) 100 MCG/2ML IJ SOLN
INTRAMUSCULAR | Status: AC
  Filled 2022-05-28: qty 2

## 2022-05-28 MED ORDER — CEFAZOLIN SODIUM-DEXTROSE 2-4 GM/100ML-% IV SOLN
INTRAVENOUS | Status: AC
  Filled 2022-05-28: qty 100

## 2022-05-28 MED ORDER — SODIUM CHLORIDE 0.9 % IV SOLN: INTRAVENOUS | Status: DC

## 2022-05-28 MED ORDER — PROPOFOL 10 MG/ML IV BOLUS
INTRAVENOUS | Status: AC
  Filled 2022-05-28: qty 20

## 2022-05-28 MED ORDER — LIDOCAINE 2% (20 MG/ML) 5 ML SYRINGE
INTRAMUSCULAR | Status: AC
  Filled 2022-05-28: qty 5

## 2022-05-28 MED ORDER — HEMOSTATIC AGENTS (NO CHARGE) OPTIME
TOPICAL | Status: DC | PRN
  Administered 2022-05-28: 1 via TOPICAL

## 2022-05-28 MED ORDER — DEXAMETHASONE SODIUM PHOSPHATE 10 MG/ML IJ SOLN
INTRAMUSCULAR | Status: AC
  Filled 2022-05-28: qty 1

## 2022-05-28 MED ORDER — MIDAZOLAM HCL 2 MG/2ML IJ SOLN
INTRAMUSCULAR | Status: AC
  Filled 2022-05-28: qty 2

## 2022-05-28 MED ORDER — VANCOMYCIN HCL 500 MG IV SOLR
INTRAVENOUS | Status: DC | PRN
  Administered 2022-05-28: 500 mg via TOPICAL

## 2022-05-28 MED ORDER — OXYCODONE HCL 5 MG/5ML PO SOLN: 5.0000 mg | Freq: Once | ORAL | Status: DC | PRN

## 2022-05-28 MED ORDER — PROPOFOL 10 MG/ML IV BOLUS
INTRAVENOUS | Status: DC | PRN
  Administered 2022-05-28: 200 mg via INTRAVENOUS

## 2022-05-28 MED ORDER — PHENYLEPHRINE HCL (PRESSORS) 10 MG/ML IV SOLN
INTRAVENOUS | Status: DC | PRN
  Administered 2022-05-28: 80 ug via INTRAVENOUS

## 2022-05-28 SURGICAL SUPPLY — 91 items
APL PRP STRL LF DISP 70% ISPRP (MISCELLANEOUS) ×1
APL SKNCLS STERI-STRIP NONHPOA (GAUZE/BANDAGES/DRESSINGS)
BANDAGE ESMARK 6X9 LF (GAUZE/BANDAGES/DRESSINGS) IMPLANT
BENZOIN TINCTURE PRP APPL 2/3 (GAUZE/BANDAGES/DRESSINGS) IMPLANT
BIT DRILL 2.5X2.75 QC CALB (BIT) IMPLANT
BIT DRILL 2.7XCANN QCK CNCT (BIT) IMPLANT
BIT DRILL CANN 2.7 (BIT) ×1
BIT DRL 2.7XCANN QCK CNCT (BIT) ×1
BIT TREPHINE CORING 8 (BIT) IMPLANT
BLADE AVERAGE 25X9 (BLADE) IMPLANT
BLADE MICRO SAGITTAL (BLADE) IMPLANT
BLADE OSC/SAG .038X5.5 CUT EDG (BLADE) IMPLANT
BLADE SURG 15 STRL LF DISP TIS (BLADE) ×2 IMPLANT
BLADE SURG 15 STRL SS (BLADE) ×3
BNDG CMPR 9X4 STRL LF SNTH (GAUZE/BANDAGES/DRESSINGS)
BNDG CMPR 9X6 STRL LF SNTH (GAUZE/BANDAGES/DRESSINGS)
BNDG ELASTIC 4X5.8 VLCR STR LF (GAUZE/BANDAGES/DRESSINGS) ×1 IMPLANT
BNDG ELASTIC 6X5.8 VLCR STR LF (GAUZE/BANDAGES/DRESSINGS) IMPLANT
BNDG ESMARK 4X9 LF (GAUZE/BANDAGES/DRESSINGS) IMPLANT
BNDG ESMARK 6X9 LF (GAUZE/BANDAGES/DRESSINGS)
BNDG GZE 12X3 1 PLY HI ABS (GAUZE/BANDAGES/DRESSINGS) ×1
BNDG STRETCH GAUZE 3IN X12FT (GAUZE/BANDAGES/DRESSINGS) ×1 IMPLANT
BOOT STEPPER DURA LG (SOFTGOODS) IMPLANT
BOOT STEPPER DURA MED (SOFTGOODS) IMPLANT
BOOT STEPPER DURA SM (SOFTGOODS) IMPLANT
BOOT STEPPER DURA XLG (SOFTGOODS) IMPLANT
CHLORAPREP W/TINT 26 (MISCELLANEOUS) ×1 IMPLANT
COVER BACK TABLE 60X90IN (DRAPES) ×1 IMPLANT
CUFF TOURN SGL QUICK 34 (TOURNIQUET CUFF)
CUFF TRNQT CYL 34X4.125X (TOURNIQUET CUFF) IMPLANT
DRAPE EXTREMITY T 121X128X90 (DISPOSABLE) ×1 IMPLANT
DRAPE OEC MINIVIEW 54X84 (DRAPES) ×1 IMPLANT
DRAPE SURG 17X23 STRL (DRAPES) IMPLANT
DRAPE U-SHAPE 47X51 STRL (DRAPES) ×1 IMPLANT
DRSG MEPITEL 4X7.2 (GAUZE/BANDAGES/DRESSINGS) ×1 IMPLANT
ELECT REM PT RETURN 9FT ADLT (ELECTROSURGICAL) ×1
ELECTRODE REM PT RTRN 9FT ADLT (ELECTROSURGICAL) ×1 IMPLANT
GAUZE PAD ABD 8X10 STRL (GAUZE/BANDAGES/DRESSINGS) ×1 IMPLANT
GAUZE SPONGE 4X4 12PLY STRL (GAUZE/BANDAGES/DRESSINGS) ×1 IMPLANT
GLOVE BIO SURGEON STRL SZ8 (GLOVE) ×1 IMPLANT
GLOVE BIOGEL PI IND STRL 7.0 (GLOVE) IMPLANT
GLOVE BIOGEL PI IND STRL 8 (GLOVE) ×2 IMPLANT
GLOVE ECLIPSE 8.0 STRL XLNG CF (GLOVE) ×1 IMPLANT
GLOVE SURG SS PI 7.0 STRL IVOR (GLOVE) IMPLANT
GOWN STRL REUS W/ TWL LRG LVL3 (GOWN DISPOSABLE) ×1 IMPLANT
GOWN STRL REUS W/ TWL XL LVL3 (GOWN DISPOSABLE) ×2 IMPLANT
GOWN STRL REUS W/TWL LRG LVL3 (GOWN DISPOSABLE) ×1
GOWN STRL REUS W/TWL XL LVL3 (GOWN DISPOSABLE) ×2
K-WIRE TROC 1.25X150 (WIRE) ×1
KWIRE TROC 1.25X150 (WIRE) IMPLANT
NEEDLE HYPO 22GX1.5 SAFETY (NEEDLE) IMPLANT
PACK BASIN DAY SURGERY FS (CUSTOM PROCEDURE TRAY) ×1 IMPLANT
PAD CAST 4YDX4 CTTN HI CHSV (CAST SUPPLIES) ×1 IMPLANT
PADDING CAST ABS COTTON 4X4 ST (CAST SUPPLIES) IMPLANT
PADDING CAST COTTON 4X4 STRL (CAST SUPPLIES) ×1
PADDING CAST COTTON 6X4 STRL (CAST SUPPLIES) IMPLANT
PENCIL SMOKE EVACUATOR (MISCELLANEOUS) ×1 IMPLANT
PLATE LG 1ST RT MTP FUSION (Plate) IMPLANT
SANITIZER HAND PURELL FF 515ML (MISCELLANEOUS) ×1 IMPLANT
SCREW CANN 4.0X38MM (Screw) ×1 IMPLANT
SCREW CANN PT 4X38 NS (Screw) IMPLANT
SCREW CORTICAL LOW PROF 3.5X20 (Screw) IMPLANT
SCREW LOCK 3.5X20 DIST TIB (Screw) IMPLANT
SCREW LOCK CORT STAR 3.5X10 (Screw) IMPLANT
SCREW LOCK CORT STAR 3.5X18 (Screw) IMPLANT
SCREW LOCK CORT STAR 3.5X20 (Screw) IMPLANT
SCREW LOW PROFILE 22MMX3.5MM (Screw) IMPLANT
SHEET MEDIUM DRAPE 40X70 STRL (DRAPES) ×1 IMPLANT
SLEEVE SCD COMPRESS KNEE MED (STOCKING) ×1 IMPLANT
SPIKE FLUID TRANSFER (MISCELLANEOUS) IMPLANT
SPLINT PLASTER CAST FAST 5X30 (CAST SUPPLIES) IMPLANT
SPONGE SURGIFOAM ABS GEL 12-7 (HEMOSTASIS) IMPLANT
SPONGE T-LAP 18X18 ~~LOC~~+RFID (SPONGE) ×1 IMPLANT
STOCKINETTE 6  STRL (DRAPES) ×1
STOCKINETTE 6 STRL (DRAPES) ×1 IMPLANT
STRIP CLOSURE SKIN 1/2X4 (GAUZE/BANDAGES/DRESSINGS) IMPLANT
SUCTION FRAZIER HANDLE 10FR (MISCELLANEOUS) ×1
SUCTION TUBE FRAZIER 10FR DISP (MISCELLANEOUS) ×1 IMPLANT
SUT ETHILON 3 0 PS 1 (SUTURE) ×1 IMPLANT
SUT MNCRL AB 3-0 PS2 18 (SUTURE) ×1 IMPLANT
SUT VIC AB 2-0 SH 18 (SUTURE) IMPLANT
SUT VIC AB 2-0 SH 27 (SUTURE) ×1
SUT VIC AB 2-0 SH 27XBRD (SUTURE) ×1 IMPLANT
SUT VICRYL 0 SH 27 (SUTURE) IMPLANT
SWAB COLLECTION DEVICE MRSA (MISCELLANEOUS) IMPLANT
SWAB CULTURE ESWAB REG 1ML (MISCELLANEOUS) IMPLANT
SYR BULB EAR ULCER 3OZ GRN STR (SYRINGE) ×1 IMPLANT
SYR CONTROL 10ML LL (SYRINGE) IMPLANT
TOWEL GREEN STERILE FF (TOWEL DISPOSABLE) ×2 IMPLANT
TUBE CONNECTING 20X1/4 (TUBING) ×1 IMPLANT
UNDERPAD 30X36 HEAVY ABSORB (UNDERPADS AND DIAPERS) ×1 IMPLANT

## 2022-05-28 NOTE — Progress Notes (Signed)
Assisted Dr. Finucane with left, adductor canal, popliteal, ultrasound guided block. Side rails up, monitors on throughout procedure. See vital signs in flow sheet. Tolerated Procedure well. 

## 2022-05-28 NOTE — Discharge Instructions (Addendum)
Toni Arthurs, MD [facility]  Please read the following information regarding your care after surgery.  Medications  You only need a prescription for the narcotic pain medicine (ex. oxycodone, Percocet, Norco).  All of the other medicines listed below are available over the counter. ? Aleve 2 pills twice a day for the first 3 days after surgery. ? acetominophen (Tylenol) 650 mg every 4-6 hours as you need for minor to moderate pain  No Tylenol until after 1:05pm today. ? oxycodone as prescribed for severe pain  Narcotic pain medicine (ex. oxycodone, Percocet, Vicodin) will cause constipation.  To prevent this problem, take the following medicines while you are taking any pain medicine. ? docusate sodium (Colace) 100 mg twice a day ? senna (Senokot) 2 tablets twice a day   Weight Bearing ? Bear weight only on your operated foot in the CAM boot.   Cast / Splint / Dressing ? Keep your splint, cast or dressing clean and dry.  Don't put anything (coat hanger, pencil, etc) down inside of it.  If it gets damp, use a hair dryer on the cool setting to dry it.  If it gets soaked, call the office to schedule an appointment for a cast change.   After your dressing, cast or splint is removed; you may shower, but do not soak or scrub the wound.  Allow the water to run over it, and then gently pat it dry.  Swelling It is normal for you to have swelling where you had surgery.  To reduce swelling and pain, keep your toes above your nose for at least 3 days after surgery.  It may be necessary to keep your foot or leg elevated for several weeks.  If it hurts, it should be elevated.  Follow Up Call my office at [phone] when you are discharged from the hospital or surgery center to schedule an appointment to be seen two weeks after surgery.  Call my office at [phone] if you develop a fever >101.5 F, nausea, vomiting, bleeding from the surgical site or severe pain.     Post Anesthesia Home Care  Instructions  Activity: Get plenty of rest for the remainder of the day. A responsible individual must stay with you for 24 hours following the procedure.  For the next 24 hours, DO NOT: -Drive a car -Advertising copywriter -Drink alcoholic beverages -Take any medication unless instructed by your physician -Make any legal decisions or sign important papers.  Meals: Start with liquid foods such as gelatin or soup. Progress to regular foods as tolerated. Avoid greasy, spicy, heavy foods. If nausea and/or vomiting occur, drink only clear liquids until the nausea and/or vomiting subsides. Call your physician if vomiting continues.  Special Instructions/Symptoms: Your throat may feel dry or sore from the anesthesia or the breathing tube placed in your throat during surgery. If this causes discomfort, gargle with warm salt water. The discomfort should disappear within 24 hours.  If you had a scopolamine patch placed behind your ear for the management of post- operative nausea and/or vomiting:  1. The medication in the patch is effective for 72 hours, after which it should be removed.  Wrap patch in a tissue and discard in the trash. Wash hands thoroughly with soap and water. 2. You may remove the patch earlier than 72 hours if you experience unpleasant side effects which may include dry mouth, dizziness or visual disturbances. 3. Avoid touching the patch. Wash your hands with soap and water after contact with the patch.  Regional Anesthesia Blocks  1. Numbness or the inability to move the "blocked" extremity may last from 3-48 hours after placement. The length of time depends on the medication injected and your individual response to the medication. If the numbness is not going away after 48 hours, call your surgeon.  2. The extremity that is blocked will need to be protected until the numbness is gone and the  Strength has returned. Because you cannot feel it, you will need to take extra care to  avoid injury. Because it may be weak, you may have difficulty moving it or using it. You may not know what position it is in without looking at it while the block is in effect.  3. For blocks in the legs and feet, returning to weight bearing and walking needs to be done carefully. You will need to wait until the numbness is entirely gone and the strength has returned. You should be able to move your leg and foot normally before you try and bear weight or walk. You will need someone to be with you when you first try to ensure you do not fall and possibly risk injury.  4. Bruising and tenderness at the needle site are common side effects and will resolve in a few days.  5. Persistent numbness or new problems with movement should be communicated to the surgeon or the [place] [phone] [place] [phone]).

## 2022-05-28 NOTE — Anesthesia Procedure Notes (Signed)
Procedure Name: LMA Insertion Date/Time: 05/28/2022 9:35 AM  Performed by: Burna Cash, CRNAPre-anesthesia Checklist: Patient identified, Emergency Drugs available, Suction available and Patient being monitored Patient Re-evaluated:Patient Re-evaluated prior to induction Oxygen Delivery Method: Circle system utilized Preoxygenation: Pre-oxygenation with 100% oxygen Induction Type: IV induction Ventilation: Mask ventilation without difficulty LMA: LMA inserted LMA Size: 5.0 Number of attempts: 1 Airway Equipment and Method: Bite block Placement Confirmation: positive ETCO2 Tube secured with: Tape Dental Injury: Teeth and Oropharynx as per pre-operative assessment

## 2022-05-28 NOTE — Op Note (Signed)
05/28/2022  11:00 AM  PATIENT:  Stephen Best  67 y.o. male  PRE-OPERATIVE DIAGNOSIS:  1.  Painful hardware left first metatarsal and hallux proximal phalanx 2.  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  Removal of deep implants left first metatarsal 2.  Removal of deep implants left hallux proximal phalanx 3.  Revision left hallux MP joint arthrodesis 4.  Large autograft from the calcaneus to the hallux MP joint 5.  Left foot AP and lateral radiographs  SURGEON:  Toni Arthurs, MD  ASSISTANT: Alfredo Martinez, PA-C  ANESTHESIA:   General, regional  EBL:  minimal   TOURNIQUET:   Total Tourniquet Time Documented: Thigh (Left) - 60 minutes Total: Thigh (Left) - 60 minutes  COMPLICATIONS:  None apparent  DISPOSITION:  Extubated, awake and stable to recovery.  INDICATION FOR PROCEDURE: 66 year old male is now 3 and half months out from left hallux MP joint arthrodesis for hallux rigidus.  In the postoperative period he developed increased swelling and pain after weaning out of the cam boot.  Radiographs revealed fracture of the plate over the hallux MP joint.  He has a painful nonunion at the joint.  He presents for revision surgery today.  The risks and benefits of the alternative treatment options have been discussed in detail.  The patient wishes to proceed with surgery and specifically understands risks of bleeding, infection, nerve damage, blood clots, need for additional surgery, amputation and death.   PROCEDURE IN DETAIL:  After pre operative consent was obtained, and the correct operative site was identified, the patient was brought to the operating room and placed supine on the OR table.  Anesthesia was administered.  Pre-operative antibiotics were administered.  A surgical timeout was taken.  The left lower extremity was prepped and draped in standard sterile fashion with a tourniquet around the thigh.  The extremity  was elevated, and the tourniquet was inflated to 250 mmHg.  The previous dorsal incision was opened again sharply.  Dissection was carried down through the subcutaneous tissues to the superficial aspect of the plate.  Fluid was obtained for aerobic anaerobic cultures adjacent to the plate.  The distal screws were removed from the plate and the proximal phalanx.  The proximal screws were removed from the plate and the first metatarsal.  The proximal plate fragment was removed without difficulty.  The distal plate fragment was freed from the proximal phalanx with an osteotome and removed in its entirety.  The MTP joint was then exposed with subperiosteal dissection circumferentially.  Fibrous tissue was cleaned from the joint with an elevator and rondure.  The wound was then irrigated copiously.  The joint could be appropriately reduced.  A 2.5 mm drill bit was used to perforate the both sides of the joint leaving the resultant bone graft in place.  An oblique incision was then made over the lateral wall of the calcaneus.  An 8 mm trephine was used to harvest cancellous autograft.  2 passes with the trephine were made.  The autograft bone was morselized after removing the cortical fragments.  The autograft was then packed into the hallux MP joint.  The joint was reduced and provisionally pinned.  AP and lateral radiographs confirmed appropriate reduction of the joint and appropriate position of the guidepin.  The guidepin was then overdrilled.  A 4 mm partially-threaded cannulated screw from the [employer] 4 mm 5 mm screw set was inserted.  It was noted to have  excellent purchase and compressed the arthrodesis site appropriately.  A large 3.5 mm Alps hallux MP joint plate was selected.  The right plate was selected in order to optimize the screw pattern.  It was provisionally pinned over the joint.  Radiographs confirmed appropriate position of the plate.  The distal portion of the plate was secured to the  proximal phalanx with a bicortical nonlocking 3.5 millimeter screw.  The proximal end of the plate was secured through the slotted hole with a nonlocking screw in bicortical fashion.  Radiographs confirmed appropriate position of the plate and appropriate alignment relative to the joint.  The remaining holes were drilled and filled with locking screws.  Final AP and lateral radiographs confirmed appropriate position and length of all hardware and appropriate alignment of the joint.  The wound was irrigated copiously and sprinkled with vancomycin powder.  Subcutaneous tissues were approximated with 2-0 Vicryl.  Skin incisions were closed with nylon.  Sterile dressings were applied followed by a compression wrap.  The tourniquet was released after application of the dressings.  The patient was awakened from anesthesia and transported to the recovery room in stable condition.   FOLLOW UP PLAN: Heel weightbearing in a cam boot.  Follow-up with me in the office in 2 weeks for suture removal.  No indication for DVT prophylaxis in this ambulatory patient.   RADIOGRAPHS: AP and lateral radiographs of the left foot are obtained intraoperatively.  These show removal of deep implants from the hallux proximal phalanx and first metatarsal.  Revision arthrodesis is also noted with appropriate alignment of all hardware.  No acute injuries are noted.    Alfredo Martinez PA-C was present and scrubbed for the duration of the operative case. His assistance was essential in positioning the patient, prepping and draping, gaining and maintaining exposure, performing the operation, closing and dressing the wounds and applying the splint.

## 2022-05-28 NOTE — Anesthesia Postprocedure Evaluation (Signed)
Anesthesia Post Note  Patient: [name]  Procedure(s) Performed: HARDWARE REMOVAL OF LEFT HALLUX PROXIMAL PHALANX AND 1ST METATARSAL (Left: Foot) REVISION ARTHRODESIS HALLUX METATARSAL PHALANGEAL JOINT WITH CALCANEAL AUTOGRAFT (Left: Foot)     Patient location during evaluation: PACU Anesthesia Type: Regional and General Level of consciousness: awake and alert, oriented and patient cooperative Pain management: pain level controlled Vital Signs Assessment: post-procedure vital signs reviewed and stable Respiratory status: spontaneous breathing, nonlabored ventilation and respiratory function stable Cardiovascular status: blood pressure returned to baseline and stable Postop Assessment: no apparent nausea or vomiting Anesthetic complications: no   No notable events documented.  Last Vitals:  Vitals:   05/28/22 1130 05/28/22 1202  BP: (!) 150/97 (!) 153/84  Pulse: 88 81  Resp: 20 20  Temp:  37 C  SpO2: 92% 96%    Last Pain:  Vitals:   05/28/22 1202  TempSrc: Oral  PainSc: 0-No pain                 Lannie Fields

## 2022-05-28 NOTE — H&P (Signed)
Stephen Best is an 66 y.o. male.   Chief Complaint: Left foot pain HPI: 66 year old male with a past medical history significant for Parkinson's disease, restless leg and neuropathy presents today for revision left forefoot surgery.  He underwent left hallux MP joint arthrodesis 3 and half months ago.  In the postoperative period he was walking out of the boot and felt pain and experienced swelling.  Radiographs revealed failure of the plate and nonunion of the hallux MP joint.  He presents today for revision hallux MP joint arthrodesis and removal of the painful hardware.  Past Medical History:  Diagnosis Date   Degenerative arthritis    HTN (hypertension)    Movement disorder    Obesity    Parkinson disease 05/02/2014   Reflux    Restless leg syndrome 05/02/2014   Sleep apnea    Vocal cord granuloma     Past Surgical History:  Procedure Laterality Date   CHOLECYSTECTOMY     KNEE SURGERY Bilateral    Arthroscopic   PAROTID GLAND TUMOR EXCISION     adenoma   vocal cord granuloma removal      Family History  Problem Relation Age of Onset   Pancreatic cancer Mother    Colon cancer Mother    Heart failure Father    Kidney disease Father    Dementia Father    Diabetes Father    Hypertension Brother    Parkinsonism Neg Hx    Social History:  reports that he has never smoked. He has never used smokeless tobacco. He reports current alcohol use. He reports that he does not currently use drugs after having used the following drugs: Marijuana.  Allergies:  Allergies  Allergen Reactions   Lisinopril     Other reaction(s): cough   Other Other (See Comments)    Hates versed     Medications Prior to Admission  Medication Sig Dispense Refill   carbidopa-levodopa (SINEMET IR) 25-250 MG tablet TAKE 1 TABLET BY MOUTH THREE TIMES A DAY 270 tablet 3   cholecalciferol (VITAMIN D) 1000 UNITS tablet Take 2,000 Units by mouth daily.     clonazePAM (KLONOPIN) 0.5 MG tablet Take 0.5  tablets (0.25 mg total) by mouth at bedtime. 45 tablet 1   gabapentin (NEURONTIN) 300 MG capsule TAKE 1 CAPSULE 2 (TWO) TIMES DAILY WITH DINNER AND AT BEDTIME. 180 capsule 0   Omeprazole Magnesium (PRILOSEC OTC PO) Take 20 mg by mouth daily.     pramipexole (MIRAPEX) 1.5 MG tablet TAKE 1 TABLET (1.5 MG TOTAL) BY MOUTH 3 (THREE) TIMES DAILY. 270 tablet 3   triamterene-hydrochlorothiazide (MAXZIDE-25) 37.5-25 MG tablet Take 1 tablet by mouth every morning.      No results found for this or any previous visit (from the past 48 hour(s)). No results found.  Review of Systems no recent fever, chills, nausea, vomiting or changes in his appetite  Height 5\' 6"  (1.676 m), weight 108.9 kg. Physical Exam  Well-nourished well-developed man in no apparent distress.  Alert and oriented.  Normal mood and affect.  Gait is antalgic to the left.  The left foot has a healed surgical incision over the hallux MP joint.  There is moderate swelling.  No signs of infection.  Pulses are palpable in the foot.  Diminished sensibility to light touch dorsally and plantarly at the forefoot.   Assessment/Plan Painful hardware left first metatarsal and hallux proximal phalanx; left first MTP joint nonunion -to the operating room today for removal of painful hardware  and revision hallux MP joint arthrodesis with possible calcaneus autograft.  The risks and benefits of the alternative treatment options have been discussed in detail.  The patient wishes to proceed with surgery and specifically understands risks of bleeding, infection, nerve damage, blood clots, need for additional surgery, amputation and death.   Toni Arthurs, MD 06-13-2022, 9:02 AM

## 2022-05-28 NOTE — Transfer of Care (Signed)
Immediate Anesthesia Transfer of Care Note  Patient: Stephen Best  Procedure(s) Performed: HARDWARE REMOVAL OF LEFT HALLUX PROXIMAL PHALANX AND 1ST METATARSAL (Left: Foot) REVISION ARTHRODESIS HALLUX METATARSAL PHALANGEAL JOINT WITH CALCANEAL AUTOGRAFT (Left: Foot)  Patient Location: PACU  Anesthesia Type:GA combined with regional for post-op pain  Level of Consciousness: sedated  Airway & Oxygen Therapy: Patient Spontanous Breathing and Patient connected to face mask oxygen  Post-op Assessment: Report given to RN and Post -op Vital signs reviewed and stable  Post vital signs: Reviewed and stable  Last Vitals:  Vitals Value Taken Time  BP 134/92 05/28/22 1102  Temp 36.6 C 05/28/22 1102  Pulse 77 05/28/22 1102  Resp 10 05/28/22 1102  SpO2 95 % 05/28/22 1102  Vitals shown include unvalidated device data.  Last Pain:  Vitals:   05/28/22 0901  TempSrc: Oral  PainSc: 0-No pain      Patients Stated Pain Goal: 5 (05/28/22 0901)  Complications: No notable events documented.

## 2022-05-29 ENCOUNTER — Encounter (HOSPITAL_BASED_OUTPATIENT_CLINIC_OR_DEPARTMENT_OTHER): Payer: Self-pay | Admitting: Orthopedic Surgery

## 2022-06-02 LAB — AEROBIC/ANAEROBIC CULTURE W GRAM STAIN (SURGICAL/DEEP WOUND)
Culture: NO GROWTH
Gram Stain: NONE SEEN

## 2022-06-02 MED ORDER — ROPIVACAINE HCL 5 MG/ML IJ SOLN
INTRAMUSCULAR | Status: DC | PRN
  Administered 2022-05-28: 40 mL via PERINEURAL

## 2022-06-02 MED ORDER — DEXAMETHASONE SODIUM PHOSPHATE 10 MG/ML IJ SOLN
INTRAMUSCULAR | Status: DC | PRN
  Administered 2022-05-28: 10 mg

## 2022-06-02 NOTE — Addendum Note (Signed)
Addendum  created 06/02/22 1058 by Lannie Fields, DO   Child order released for a procedure order, Clinical Note Signed, Intraprocedure Blocks edited, Intraprocedure Meds edited, SmartForm saved

## 2022-06-02 NOTE — Anesthesia Procedure Notes (Signed)
Anesthesia Regional Block: Adductor canal block   Pre-Anesthetic Checklist: , timeout performed,  Correct Patient, Correct Site, Correct Laterality,  Correct Procedure, Correct Position, site marked,  Risks and benefits discussed,  Surgical consent,  Pre-op evaluation,  At surgeon's request and post-op pain management  Laterality: Left  Prep: Maximum Sterile Barrier Precautions used, chloraprep       Needles:  Injection technique: Single-shot  Needle Type: Echogenic Stimulator Needle     Needle Length: 9cm  Needle Gauge: 22     Additional Needles:   Procedures:,,,, ultrasound used (permanent image in chart),,    Narrative:  Start time: 06/02/2022 9:03 AM End time: 06/02/2022 9:05 AM Injection made incrementally with aspirations every 5 mL.  Performed by: Personally  Anesthesiologist: Lannie Fields, DO  Additional Notes: Monitors applied. No increased pain on injection. No increased resistance to injection. Injection made in 5cc increments. Good needle visualization. Patient tolerated procedure well.

## 2022-06-02 NOTE — Anesthesia Procedure Notes (Signed)
Anesthesia Regional Block: Popliteal block   Pre-Anesthetic Checklist: , timeout performed,  Correct Patient, Correct Site, Correct Laterality,  Correct Procedure, Correct Position, site marked,  Risks and benefits discussed,  Surgical consent,  Pre-op evaluation,  At surgeon's request and post-op pain management  Laterality: Left  Prep: Maximum Sterile Barrier Precautions used, chloraprep       Needles:  Injection technique: Single-shot  Needle Type: Echogenic Stimulator Needle     Needle Length: 9cm  Needle Gauge: 22     Additional Needles:   Procedures:,,,, ultrasound used (permanent image in chart),,    Narrative:  Start time: 05/28/2022 9:00 AM End time: 05/28/2022 9:03 AM Injection made incrementally with aspirations every 5 mL.  Performed by: Personally  Anesthesiologist: Lannie Fields, DO  Additional Notes: Monitors applied. No increased pain on injection. No increased resistance to injection. Injection made in 5cc increments. Good needle visualization. Patient tolerated procedure well.

## 2022-06-23 ENCOUNTER — Encounter: Payer: Self-pay | Admitting: Neurology

## 2022-07-13 DIAGNOSIS — Z4789 Encounter for other orthopedic aftercare: Secondary | ICD-10-CM | POA: Diagnosis not present

## 2022-07-21 DIAGNOSIS — H2513 Age-related nuclear cataract, bilateral: Secondary | ICD-10-CM | POA: Diagnosis not present

## 2022-07-21 DIAGNOSIS — H524 Presbyopia: Secondary | ICD-10-CM | POA: Diagnosis not present

## 2022-07-21 DIAGNOSIS — Z0100 Encounter for examination of eyes and vision without abnormal findings: Secondary | ICD-10-CM | POA: Diagnosis not present

## 2022-07-28 ENCOUNTER — Other Ambulatory Visit (HOSPITAL_BASED_OUTPATIENT_CLINIC_OR_DEPARTMENT_OTHER): Payer: Self-pay

## 2022-07-28 MED ORDER — COMIRNATY 30 MCG/0.3ML IM SUSY
PREFILLED_SYRINGE | INTRAMUSCULAR | 0 refills | Status: DC
  Filled 2022-07-28: qty 0.3, 1d supply, fill #0

## 2022-08-12 DIAGNOSIS — M2022 Hallux rigidus, left foot: Secondary | ICD-10-CM | POA: Diagnosis not present

## 2022-08-12 DIAGNOSIS — Z4789 Encounter for other orthopedic aftercare: Secondary | ICD-10-CM | POA: Diagnosis not present

## 2022-09-10 ENCOUNTER — Other Ambulatory Visit: Payer: Self-pay | Admitting: Neurology

## 2022-09-10 DIAGNOSIS — G2581 Restless legs syndrome: Secondary | ICD-10-CM

## 2022-09-14 DIAGNOSIS — M2022 Hallux rigidus, left foot: Secondary | ICD-10-CM | POA: Diagnosis not present

## 2022-09-15 NOTE — Progress Notes (Unsigned)
Assessment/Plan:    1.  Parkinsons Disease , by hx  -I was able to see him off of the carbidopa/levodopa and pramipexole and he did look mildly parkinsonian with sx's on R; correlates with DaT scan  -His DaTscan does demonstrate loss of dopamine on the left.  -We discussed that the new skin biopsy for alpha-synuclein hold on that for now (may also be good to hold until he hits [facility])  -He is supposed to be taking carbidopa/levodopa 25/250, 1 tablet 3 times per day, but takes 1 in the morning and 2 at bedtime.  -  2.  Restless leg  -This has been very refractory to treatment.  Unfortunately, we really have been struggling with augmentation.  I had previously weaned down his pramipexole, but there was a pharmacy error that occurred apparently a year ago and I did not know it and he is back up on the higher dose.  This really is not an inappropriate dose (although it is the max dose) for Parkinson's, but I do think he gets augmentation from this, and then he does not take it 3 times per day as directed and lumps all of the dose at bedtime, creating more issues.  -He found Ultram ineffective.  -He was previously on oxycodone (not necessarily for this)  -He found higher dosages of gabapentin with cognitive dulling.  He is currently on gabapentin, 300 mg after dinner and at bed  -*** klonopin, 0.5 mg, 1/2 po q hs.  Discussed r/b/se  -Discussed with him again that he either needs to see a sleep medicine specialist (we previously referred him to [facility] sleep medicine) or follow-up with pain management about this.  I have no further options for his restless leg.   Subjective:   Dessie Roelofs was seen today in follow-up for previously diagnosed Parkinson's disease.  Last visit, I added clonazepam for restless leg.  He emailed [place] back a few months later stating that his restless leg was still bothersome.  I told him that I really did not have further ideas for restless leg, which is why we had sent a  referral to [facility] sleep center.  We recommended that he reach out to them, and if he did not get an appointment, we would need to reinitiate that referral.  He has been on benzodiazepines, dopamine agonists, dopamine, opioids.  Separately, when he first came to me I reduced his dose of pramipexole, but there was an error when the pharmacy went to refill it last March and it was refilled at the higher (previous) dose.  Last visit, we added tramadol at bedtime for his restless leg symptoms.  He only took it for a few days (never got a prescription for more than 7 pills) and stopped it as he did not think it was helpful.  He emailed me back and he was frustrated with the restless leg symptoms.  We recommended [facility] neurology sleep medicine.  Current movement disorder medications: Pramipexole, 1.5 mg 3 times per day Carbidopa/levodopa 25/250, 1 tablet 3 times per day (he reports taking in AM and bed only) Gabapentin, 300 mg, at dinner and at bed Clonazepam, 0.5 mg, half tablet at bedtime (started last visit)  PREVIOUS MEDICATIONS: requip (for rls); pramipexole (used to be on 3.75 mg q hs just for rls); ultram; xanax  ALLERGIES:   Allergies  Allergen Reactions   Lisinopril     Other reaction(s): cough   Other Other (See Comments)    Hates versed  CURRENT MEDICATIONS:  No outpatient medications have been marked as taking for the 09/16/22 encounter (Appointment) with Sharmon Cheramie, Octaviano Batty, DO.     Objective:   VITALS:   There were no vitals filed for this visit.      GEN:  The patient appears stated age and is in NAD. HEENT:  Normocephalic, atraumatic.   Neurological examination:  Orientation: The patient is alert and oriented x3.  Cranial nerves: There is good facial symmetry.  There is no significant facial hypomimia.  He has some periorbital edema around the right eye that he states is chronic.  Extraocular muscles are intact. The speech is fluent and clear. Hearing is intact to  conversational tone. Motor: Strength is 5/5 in the bilateral upper extremities and at least antigravity in the lower extremities.   Movement examination: Tone: There is normal tone in the bilateral upper extremities.  The tone in the lower extremities is normal.  Abnormal movements: rare tremor of the R thumb Coordination:  There is mild decremation on the R Gait and Station: The patient has no difficulty arising out of a deep-seated chair without the use of the hands. The patient's stride length is decreased with decreased arm swing on the R.   I have reviewed and interpreted the following labs independently   Chemistry      Component Value Date/Time   NA 139 04/17/2022 1150   NA 143 09/16/2016 1707   K 4.5 04/17/2022 1150   CL 105 04/17/2022 1150   CO2 26 04/17/2022 1150   BUN 10 04/17/2022 1150   BUN 10 09/16/2016 1707   CREATININE 0.95 04/17/2022 1150      Component Value Date/Time   CALCIUM 9.4 04/17/2022 1150   ALKPHOS 70 09/16/2016 1707   AST 16 09/16/2016 1707   ALT 21 09/16/2016 1707   BILITOT 0.4 09/16/2016 1707      Lab Results  Component Value Date   TSH 1.43 07/07/2021   Lab Results  Component Value Date   WBC 7.7 07/07/2021   HGB 14.6 07/07/2021   HCT 45.2 07/07/2021   MCV 82.7 07/07/2021   PLT 248.0 07/07/2021    Total time spent on today's visit was *** minutes, including both face-to-face time and nonface-to-face time.  Time included that spent on review of records (prior notes available to me/labs/imaging if pertinent), discussing treatment and goals, answering patient's questions and coordinating care.   Cc:  [name], MD

## 2022-09-16 ENCOUNTER — Ambulatory Visit: Payer: Medicare HMO | Admitting: Neurology

## 2022-09-16 ENCOUNTER — Encounter: Payer: Self-pay | Admitting: Neurology

## 2022-09-16 VITALS — BP 128/80 | HR 73 | Ht 65.0 in | Wt 244.2 lb

## 2022-09-16 DIAGNOSIS — G20A1 Parkinson's disease without dyskinesia, without mention of fluctuations: Secondary | ICD-10-CM

## 2022-09-16 DIAGNOSIS — G2581 Restless legs syndrome: Secondary | ICD-10-CM | POA: Diagnosis not present

## 2022-09-16 MED ORDER — CARBIDOPA-LEVODOPA 25-100 MG PO TABS: 2.0000 | ORAL_TABLET | Freq: Three times a day (TID) | ORAL | 1 refills | Status: DC

## 2022-09-16 MED ORDER — GABAPENTIN 300 MG PO CAPS: ORAL_CAPSULE | ORAL | 1 refills | Status: DC

## 2022-09-16 MED ORDER — CLONAZEPAM 0.5 MG PO TABS: 0.2500 mg | ORAL_TABLET | Freq: Every day | ORAL | 1 refills | Status: DC

## 2022-09-16 NOTE — Patient Instructions (Signed)
Local and Online Resources for Power over Parkinson's Group  April 2024   LOCAL [place] PARKINSON'S GROUPS   Power over Parkinson's Group:    Power Over Parkinson's Patient Education Group will be Wednesday, April 10th-*Hybrid meting*- in person at [place] [facility] location and via WEBEX, 2:00-3:00 pm.   Power over Parkinson's and Care Partner Groups will meet together, with plans for separate break out session for caregivers, depending on topic/speaker Upcoming Power over Parkinson's Meetings/Care Partner Support:  2nd Wednesdays of the month at 2 pm:   April 10th, May 8th Contact Amy Marriott at [email or web address] if interested in participating in this group    LOCAL EVENTS AND NEW OFFERINGS  NEW:  Parkinson's Social Game Night.  First Thursday of each month, 2:00-4:00 pm.  *Next date is April 4th*.  [facility], [place].  Contact [email or web address] if interested. Parkinson's CarePartner Group for Men is in the works, if interested email [email or web address] ACT FITNESS Chair Yoga classes "Train and Gain", Fridays 10 am, [employer].  Contact Gina at [phone].  Community Fitness Instructor-Led Parkinson's Exercises Classes offering at [employer]!  TUESDAYS (Chair Yoga)  and Wednesdays (PWR! Moves)  1:00 pm.   Contact Christy Weaver at  [email or web address]  or [phone]  Drumming for Parkinson's will be held on 2nd and 4th Mondays at 11:00 am.   Located at the [employer] ([address] [place].)  Contact Jane Maydian at [email or web address] or [phone]  Dance for Parkinson 's classes will be on Tuesdays 10-11 am. Located in the [facility] [facility], in the first floor of the [facility] ([address] To register:  [email or web address] or [phone] [employer] Parkinson's Tai Chi Class, Mondays at 11 am.  Call [phone] for  details Hamil-Kerr Challenge.  Bike, Run, Walk Fundraiser for Parkinson's.  Saturday, April 6th at [place].  To register, visit [email or web address] Moving Day [place].  Saturday, May 4th, 10 am start.  Register at [email or web address]    ONLINE EDUCATION AND SUPPORT  Parkinson Foundation:  www.parkinson.org  PD Health at Home continues:  Mindfulness Mondays, Wellness Wednesdays, Fitness Fridays  (PWR! Moves as part of Fitness Fridays March 22nd, 1-1:45 pm) Upcoming Education:   Parkinson's 101.  Wednesday, April 3rd, 1-2 pm Movement for Parkinson's.  Wednesday, May 1st, 1-2 pm Expert Briefing:  Research Update:  Working to Halt PD.  Wednesday, April 10th, 1-2 pm Trouble with Zzz's:  Sleep Challenges with Parkinson's.  Wed, May 8th 1-2 pm Register for virtual education and expert briefings (webinars) at [email or web address]/resources-support/[email or web address]-education Please check out their website to sign up for emails and see their full online offerings     Michael [employer] Fox Foundation:  www.michaeljfox.org   Third Thursday Webinars:  On the third Thursday of every month at 12 p.m. ET, join our free live webinars to learn about various aspects of living with Parkinson's disease and our work to speed medical breakthroughs.  Upcoming Webinar:  Let's Talk Taboos:  Hard-to-Discuss Parkinson's Symptoms.  Thursday, April 18th at 12 noon. Check out additional information on their website to see their full online offerings    Davis Phinney Foundation:  www.davisphinneyfoundation.org  Upcoming Webinar:   [facility].  Wednesday, April 2nd, 4 pm Series:  Living with Parkinson's Meetup.   Third Thursdays each month, 3 pm  Care Partner Monthly Meetup.  With Connie Carpenter Phinney.  First Tuesday of each month, 2 pm  Check out additional   information to Live Well Today on their website    Parkinson and Movement Disorders (PMD) Alliance:  www.pmdalliance.org  [employer]  Online:  Online Education Events  Sign up for emails, which are sent weekly to give you updates on programming and online offerings    Parkinson's Association of [place]:  www.parkinsonassociation.org  Information on online support groups, education events, and online exercises including Yoga, Parkinson's exercises and more-LOTS of information on links to PD resources and online events  Virtual Support Group through Parkinson's Association of [place]; next one is scheduled for Wednesday, April 3rd  MOVEMENT AND EXERCISE OPPORTUNITIES  PWR! Moves Classes at [place] Exercise Room.  Wednesdays 10 and 11 am.   Contact Amy Marriott, PT [email or web address] if interested.  Parkinson's Exercise Class offerings at [employer]. *TUESDAYS* (Chair yoga) and Wednesdays (PWR! Moves)  1:00 pm.    Contact Christy Weaver at [email or web address]    Parkinson's Wellness Recovery (PWR! Moves)  www.pwr4life.org  Info on the PWR! Virtual Experience:  You will have access to our expertise?through self-assessment, guided plans that start with the PD-specific fundamentals, educational content, tips, Q&A with an expert, and a growing library of PD-specific pre-recorded and live exercise classes of varying types and intensity - both physical and cognitive! If that is not enough, we offer 1:1 wellness consultations (in-person or virtual) to personalize your PWR! Virtual Experience.   Parkinson Foundation Fitness Fridays:   As part of the PD Health @ Home program, this free video series focuses each week on one aspect of fitness designed to support people living with Parkinson's.? These weekly videos highlight the Parkinson Foundation fitness guidelines for people with Parkinson's disease.  www.parkinson.org/resources-support/online-education/pdhealth#ff  Dance for PD website is offering free, live-stream classes throughout the week, as well as links to digital library of classes:   [email or web address]  Virtual dance and Pilates for Parkinson's classes: Click on the Community Tab> Parkinson's Movement Initiative Tab.  To register for classes and for more information, visit www.[email or web address] and click the "community" tab.   [employer] Parkinson's Cycling Classes   [employer]:  Thursdays @ Noon-Live classes at [employer] (Contact Margaret Hazen at [email or web address]?or [phone])  [employer]: Classes Tuesday, Wednesday and Thursday (contact Marlee at [email or web address] ?or [phone])  [place] Steady Boxing  Varied levels of classes are offered Tuesdays and Thursdays at [employer] [facility].   Stretching with Maria weekly class is also offered for people with Parkinson's  To observe a class or for more information, call [phone] or email Hillary Savage at [email or web address]   ADDITIONAL SUPPORT AND RESOURCES  [employer]:  Online Caregiver Education Opportunities:  www.well-springsolutions.org/caregiver-education/caregiver-support-group.  You may also contact Jodi Kolada at [email or web address] or [phone].     [employer] April Offerings National HealthCare Decisions Day:  The Most Critical Legal and Medical Decisions to Consider Now!  Tuesday, April 16th, 1-3 pm at [facility] at [address].  Contact Jodi Kolada at [email or web address] or [phone] Powerful Tools for Caregivers.  6 week educational series for caregivers.  April 18-May 23, 10:30 am-12:15 pm at [facility] 3rd Floor Conference Room.   Contact Jodi Kolada at [email or web address] or [phone] to register [employer] Navigator:  Just1Navigator program, a?free service to help individuals and families through the journey of determining care for older adults.  The "Navigator" is a social worker, Nicole Reynolds, who will speak with a prospective client and/or loved  ones to provide an assessment of the situation and a set of recommendations for a personalized care   plan -- all free of charge, and whether?[employer] offers the needed service or not. If the need is not a service we provide, we are well-connected with reputable programs in town that we can refer you to.  www.well-springsolutions.org or to speak with the Navigator, call [phone].     

## 2022-09-30 DIAGNOSIS — K219 Gastro-esophageal reflux disease without esophagitis: Secondary | ICD-10-CM | POA: Diagnosis not present

## 2022-10-14 DIAGNOSIS — M2022 Hallux rigidus, left foot: Secondary | ICD-10-CM | POA: Diagnosis not present

## 2022-10-24 ENCOUNTER — Other Ambulatory Visit: Payer: Self-pay | Admitting: Neurology

## 2022-10-24 DIAGNOSIS — G2581 Restless legs syndrome: Secondary | ICD-10-CM

## 2022-11-26 DIAGNOSIS — L89899 Pressure ulcer of other site, unspecified stage: Secondary | ICD-10-CM | POA: Diagnosis not present

## 2022-11-26 DIAGNOSIS — T8484XA Pain due to internal orthopedic prosthetic devices, implants and grafts, initial encounter: Secondary | ICD-10-CM | POA: Diagnosis not present

## 2022-12-24 ENCOUNTER — Ambulatory Visit (INDEPENDENT_AMBULATORY_CARE_PROVIDER_SITE_OTHER): Payer: Medicare HMO

## 2022-12-24 ENCOUNTER — Ambulatory Visit
Admission: EM | Admit: 2022-12-24 | Discharge: 2022-12-24 | Disposition: A | Discharge status: Home / Self Care | Payer: Medicare HMO | Attending: Internal Medicine | Admitting: Internal Medicine

## 2022-12-24 DIAGNOSIS — M25462 Effusion, left knee: Secondary | ICD-10-CM | POA: Diagnosis not present

## 2022-12-24 DIAGNOSIS — M7042 Prepatellar bursitis, left knee: Secondary | ICD-10-CM | POA: Diagnosis not present

## 2022-12-24 DIAGNOSIS — S8992XD Unspecified injury of left lower leg, subsequent encounter: Secondary | ICD-10-CM | POA: Diagnosis not present

## 2022-12-24 NOTE — ED Triage Notes (Addendum)
Pt presents to UC w/ c/o fluid on left knee x1.5 weeks. Reports pain only when kneeling Fell on left knee 2 months ago. No hx of surgery to left knee.

## 2022-12-24 NOTE — Discharge Instructions (Signed)
Ace wrap to the knee to help with the swelling.  You can elevate and ice as needed.  Use over-the-counter ibuprofen or Aleve as needed for inflammation/pain.  Please follow-up with your PCP or [facility] if your symptoms or not improving.  Please go to the ER for any worsening symptoms.  I hope you feel better soon!

## 2022-12-24 NOTE — ED Provider Notes (Signed)
[facility]    CSN: [id number] Arrival date & time: 12/24/22  0802      History   Chief Complaint No chief complaint on file.   HPI Stephen Best is a 67 y.o. male presents for evaluation of knee swelling.  Patient reports 2 months ago he fell onto his left knee.  He did not seek evaluation at that time and states he had no pain or swelling.  A week and a half ago he developed swelling of the anterior knee.  He still denies any pain with weightbearing or movement.  Denies any warmth, erythema, or new injury.  Denies any history of injuries or surgeries to the left knee.  No OTC medications have been used since onset.  No other concerns at this time.  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   Blepharospasm 12/19/2012    Past Surgical History:  Procedure Laterality Date   ARTHRODESIS METATARSALPHALANGEAL JOINT (MTPJ) Left 05/28/2022   Procedure: REVISION ARTHRODESIS HALLUX METATARSAL PHALANGEAL JOINT WITH CALCANEAL AUTOGRAFT;  Surgeon: Toni Arthurs, MD;  Location: [facility];  Service: Orthopedics;  Laterality: Left;   CHOLECYSTECTOMY     HARDWARE REMOVAL Left 05/28/2022   Procedure: HARDWARE REMOVAL OF LEFT HALLUX PROXIMAL PHALANX AND 1ST METATARSAL;  Surgeon: Toni Arthurs, MD;  Location: [facility];  Service: Orthopedics;  Laterality: Left;   KNEE SURGERY Bilateral    Arthroscopic   PAROTID GLAND TUMOR EXCISION     adenoma   vocal cord granuloma removal         Home Medications    Prior to Admission  medications   Medication Sig Start Date End Date Taking? Authorizing Provider  carbidopa-levodopa (SINEMET IR) 25-100 MG tablet Take 2 tablets by mouth 3 (three) times daily. 8am/noon/4pm 09/16/22   Tat, Octaviano Batty, DO  cholecalciferol (VITAMIN D) 1000 UNITS tablet Take 2,000 Units by mouth daily.    [provider]  clonazePAM (KLONOPIN) 0.5 MG tablet Take 0.5 tablets (0.25 mg total) by mouth at bedtime. 09/16/22   TatOctaviano Batty, DO  COVID-19 mRNA vaccine [id number] (COMIRNATY) syringe Inject into the muscle. 07/28/22   Judyann Munson, MD  gabapentin (NEURONTIN) 300 MG capsule TAKE 1 CAPSULE 2 (TWO) TIMES DAILY WITH DINNER AND AT BEDTIME. 09/16/22   Tat, Octaviano Batty, DO  Omeprazole Magnesium (PRILOSEC OTC PO) Take 20 mg by mouth daily.    [provider]  pramipexole (MIRAPEX) 1.5 MG tablet TAKE 1 TABLET (1.5 MG TOTAL) BY MOUTH 3 (THREE) TIMES DAILY. 10/26/22   Tat, Octaviano Batty, DO  triamterene-hydrochlorothiazide (MAXZIDE-25) 37.5-25 MG tablet Take 1 tablet by mouth every morning. 12/13/19   [provider]    Family History Family History  Problem Relation Age of Onset   Pancreatic cancer Mother    Colon cancer Mother    Heart failure Father    Kidney disease Father    Dementia Father    Diabetes Father    Hypertension Brother  Parkinsonism Neg Hx     Social History Social History   Tobacco Use   Smoking status: Never   Smokeless tobacco: Never  Vaping Use   Vaping status: Never Used  Substance Use Topics   Alcohol use: Yes    Comment: few times per week   Drug use: Not Currently    Types: Marijuana     Allergies   Lisinopril and Other   Review of Systems Review of Systems  Musculoskeletal:        Left knee swelling     Physical Exam Triage Vital Signs ED Triage Vitals  Encounter Vitals Group     BP 12/24/22 0816 (!) 143/90     Systolic BP Percentile --      Diastolic BP Percentile --      Pulse Rate 12/24/22 0816 78     Resp 12/24/22  0816 16     Temp 12/24/22 0816 98.1 F (36.7 C)     Temp Source 12/24/22 0816 Oral     SpO2 12/24/22 0832 95 %     Weight --      Height --      Head Circumference --      [place] Flow --      Pain Score 12/24/22 0823 0     Pain Loc --      Pain Education --      Exclude from Growth Chart --    No data found.  Updated Vital Signs BP (!) 143/90 (BP Location: Right Arm)   Pulse 78   Temp 98.1 F (36.7 C) (Oral)   Resp 16   SpO2 95%   Visual Acuity Right Eye Distance:   Left Eye Distance:   Bilateral Distance:    Right Eye Near:   Left Eye Near:    Bilateral Near:     Physical Exam Vitals and nursing note reviewed.  Constitutional:      Appearance: Normal appearance.  HENT:     Head: Normocephalic and atraumatic.  Eyes:     Pupils: Pupils are equal, round, and reactive to light.  Cardiovascular:     Rate and Rhythm: Normal rate.  Pulmonary:     Effort: Pulmonary effort is normal.  Musculoskeletal:     Left knee: Swelling and effusion present. No deformity, erythema, ecchymosis, lacerations, bony tenderness or crepitus. Normal range of motion. No tenderness. Normal patellar mobility. Normal pulse.     Comments: Moderate effusion overlying left patella.  No warmth or erythema.  Skin:    General: Skin is warm and dry.  Neurological:     General: No focal deficit present.     Mental Status: He is alert and oriented to person, place, and time.  Psychiatric:        Mood and Affect: Mood normal.        Behavior: Behavior normal.      UC Treatments / Results  Labs (all labs ordered are listed, but only abnormal results are displayed) Labs Reviewed - No data to display  EKG   Radiology DG Knee Complete 4 Views Left  Result Date: 12/24/2022 CLINICAL DATA:  Trauma, fall 2 months ago, soft tissue swelling EXAM: LEFT KNEE - COMPLETE 4+ VIEW COMPARISON:  None Available. FINDINGS: No fracture or dislocation is seen. There is calcification at the attachment of  quadriceps tendon to the patella, possibly calcific tendinosis. There is no effusion in suprapatellar bursa. There is marked soft tissue swelling anterior to the patella. IMPRESSION: No  fracture or dislocation is seen. There is marked soft tissue swelling anterior to the patella suggesting prepatellar bursitis or hematoma. Calcification at the attachment of quadriceps tendon to the patella may suggest calcific tendinosis. Electronically Signed   By: Ernie Avena M.D.   On: 12/24/2022 08:49    Procedures Procedures (including critical care time)  Medications Ordered in UC Medications - No data to display  Initial Impression / Assessment and Plan / UC Course  I have reviewed the triage vital signs and the nursing notes.  Pertinent labs & imaging results that were available during my care of the patient were reviewed by me and considered in my medical decision making (see chart for details).     Reviewed exam and symptoms with patient.  No red flags.  X-ray with prepatellar bursitis.  No sign of septic arthritis or cellulitis on exam.  Discussed RICE therapy and Ace wrap applied in clinic.  OTC analgesics as needed.  PCP or [facility] follow-up if symptoms do not improve.  ER precautions reviewed and patient verbalized understanding. Final Clinical Impressions(s) / UC Diagnoses   Final diagnoses:  Swelling of joint of left knee  Prepatellar bursitis of left knee     Discharge Instructions      Ace wrap to the knee to help with the swelling.  You can elevate and ice as needed.  Use over-the-counter ibuprofen or Aleve as needed for inflammation/pain.  Please follow-up with your PCP or [facility] if your symptoms or not improving.  Please go to the ER for any worsening symptoms.  I hope you feel better soon!     ED Prescriptions   None    PDMP not reviewed this encounter.   Radford Pax, NP 12/24/22 0900

## 2023-01-13 DIAGNOSIS — Z23 Encounter for immunization: Secondary | ICD-10-CM | POA: Diagnosis not present

## 2023-01-13 DIAGNOSIS — Z0000 Encounter for general adult medical examination without abnormal findings: Secondary | ICD-10-CM | POA: Diagnosis not present

## 2023-01-13 DIAGNOSIS — G20C Parkinsonism, unspecified: Secondary | ICD-10-CM | POA: Diagnosis not present

## 2023-01-13 DIAGNOSIS — I5189 Other ill-defined heart diseases: Secondary | ICD-10-CM | POA: Diagnosis not present

## 2023-01-13 DIAGNOSIS — K219 Gastro-esophageal reflux disease without esophagitis: Secondary | ICD-10-CM | POA: Diagnosis not present

## 2023-01-13 DIAGNOSIS — Z125 Encounter for screening for malignant neoplasm of prostate: Secondary | ICD-10-CM | POA: Diagnosis not present

## 2023-01-13 DIAGNOSIS — G4733 Obstructive sleep apnea (adult) (pediatric): Secondary | ICD-10-CM | POA: Diagnosis not present

## 2023-01-13 DIAGNOSIS — Z5181 Encounter for therapeutic drug level monitoring: Secondary | ICD-10-CM | POA: Diagnosis not present

## 2023-01-13 DIAGNOSIS — I10 Essential (primary) hypertension: Secondary | ICD-10-CM | POA: Diagnosis not present

## 2023-01-25 ENCOUNTER — Other Ambulatory Visit: Payer: Self-pay | Admitting: Neurology

## 2023-01-25 DIAGNOSIS — G2581 Restless legs syndrome: Secondary | ICD-10-CM

## 2023-02-11 ENCOUNTER — Encounter: Payer: Self-pay | Admitting: Podiatry

## 2023-02-11 ENCOUNTER — Ambulatory Visit: Payer: Medicare HMO | Admitting: Podiatry

## 2023-02-11 DIAGNOSIS — L600 Ingrowing nail: Secondary | ICD-10-CM

## 2023-02-11 DIAGNOSIS — M79609 Pain in unspecified limb: Secondary | ICD-10-CM

## 2023-02-11 DIAGNOSIS — B351 Tinea unguium: Secondary | ICD-10-CM | POA: Diagnosis not present

## 2023-02-11 DIAGNOSIS — M5451 Vertebrogenic low back pain: Secondary | ICD-10-CM | POA: Diagnosis not present

## 2023-02-11 DIAGNOSIS — M4310 Spondylolisthesis, site unspecified: Secondary | ICD-10-CM | POA: Diagnosis not present

## 2023-02-11 NOTE — Progress Notes (Signed)
Subjective:   Patient ID: Stephen Best, male   DOB: 67 y.o.   MRN: [id number]   HPI Patient presents after not being seen for 6 years with pain in the big toe left lateral border painful when pressed pressing against the second toe with ingrown toenail component and elongated thickened nailbeds 1-5 both feet with history of fusion of the first MPJ left last year that required to surgery.  Patient does not currently smoke tries to be active   Review of Systems  All other systems reviewed and are negative.       Objective:  Physical Exam Vitals and nursing note reviewed.  Constitutional:      Appearance: He is well-developed.  Pulmonary:     Effort: Pulmonary effort is normal.  Musculoskeletal:        General: Normal range of motion.  Skin:    General: Skin is warm.  Neurological:     Mental Status: He is alert.     Neurovascular status intact muscle strength found to be adequate range of motion adequate with patient noted to have incurvated left hallux lateral border painful when pressed and thickened yellowed brittle nailbeds 1-5 both feet painful inability to wear shoe gear comfortably.  Good digital perfusion well-oriented x 3     Assessment:  Ingrown toenail deformity left hallux lateral border with pain along with mycotic nail infection nailbeds 1-5 both feet     Plan:  H&P all conditions reviewed and at this point on focusing on the ingrown and the nail disease and I went ahead and recommended correction of the nail border left explained the procedure allowed him to sign consent form and today I infiltrated 60 mg like Marcaine mixture sterile prep done using sterile instrumentation remove the lateral border exposed matrix applied phenol 3 applications 30 seconds followed by alcohol lavage sterile dressing gave instructions on soaks and wear dressing 24 hours taken off earlier if throbbing were to occur.  Debrided nailbeds 1-5 both feet Neutra genic bleeding reappoint routine  care

## 2023-02-11 NOTE — Patient Instructions (Signed)

## 2023-02-19 ENCOUNTER — Telehealth: Payer: Self-pay | Admitting: Podiatry

## 2023-02-19 ENCOUNTER — Other Ambulatory Visit: Payer: Self-pay | Admitting: Podiatry

## 2023-02-19 MED ORDER — CEPHALEXIN 500 MG PO CAPS: 500.0000 mg | ORAL_CAPSULE | Freq: Four times a day (QID) | ORAL | 0 refills | Status: AC

## 2023-02-19 NOTE — Telephone Encounter (Signed)
Pt called and had ingrown procedure with Dr Charlsie Merles and thinks it is now infected and was asking for an antibiotic to be called in please.  He was seen 9.5.2024.The pharmacy is correct in the chart.

## 2023-02-19 NOTE — Telephone Encounter (Signed)
Left vm for pt that antibiotic was called in. Pt called back and I told him the antibiotics were call in.

## 2023-02-22 ENCOUNTER — Ambulatory Visit: Payer: Medicare HMO | Admitting: Podiatry

## 2023-02-22 ENCOUNTER — Encounter: Payer: Self-pay | Admitting: Podiatry

## 2023-02-22 DIAGNOSIS — L03032 Cellulitis of left toe: Secondary | ICD-10-CM | POA: Diagnosis not present

## 2023-02-22 NOTE — Patient Instructions (Signed)

## 2023-02-23 NOTE — Progress Notes (Signed)
Subjective:   Patient ID: Stephen Best, male   DOB: 67 y.o.   MRN: [id number]   HPI Patient presents stating he has had some redness and drainage of the left hallux lateral side and is concerned about infection after having ingrown toenail but fixation about 10 days ago   ROS      Objective:  Physical Exam  There are status intact with erythema of the lateral border the left hallux with localized slightly proximal but no erythema edema to the inner phalangeal joint or proximal to this point with no systemic signs of infection     Assessment:  Paronychia infection of the left hallux lateral side localized with patient on antibiotics     Plan:  H&P continue antibiotic at this time continue soaks bandage usage and patient will be seen back for [place] to recheck and may require opening the area up if it does not improve and educated patient on what to look for

## 2023-02-24 DIAGNOSIS — G4733 Obstructive sleep apnea (adult) (pediatric): Secondary | ICD-10-CM | POA: Diagnosis not present

## 2023-02-24 DIAGNOSIS — G4700 Insomnia, unspecified: Secondary | ICD-10-CM | POA: Diagnosis not present

## 2023-02-24 DIAGNOSIS — R5383 Other fatigue: Secondary | ICD-10-CM | POA: Diagnosis not present

## 2023-02-24 DIAGNOSIS — G2581 Restless legs syndrome: Secondary | ICD-10-CM | POA: Diagnosis not present

## 2023-02-26 ENCOUNTER — Telehealth: Payer: Self-pay | Admitting: Podiatry

## 2023-02-26 ENCOUNTER — Other Ambulatory Visit: Payer: Self-pay | Admitting: Podiatry

## 2023-02-26 MED ORDER — CEPHALEXIN 500 MG PO CAPS: 500.0000 mg | ORAL_CAPSULE | Freq: Four times a day (QID) | ORAL | 1 refills | Status: DC

## 2023-02-26 NOTE — Telephone Encounter (Signed)
Called in.

## 2023-02-26 NOTE — Telephone Encounter (Signed)
Pt would like a refill on the medication cephALEXin (KEFLEX) 500 MG capsule prescribed on 9/13.

## 2023-03-04 DIAGNOSIS — M5451 Vertebrogenic low back pain: Secondary | ICD-10-CM | POA: Diagnosis not present

## 2023-03-16 DIAGNOSIS — M4310 Spondylolisthesis, site unspecified: Secondary | ICD-10-CM | POA: Diagnosis not present

## 2023-03-16 DIAGNOSIS — M5416 Radiculopathy, lumbar region: Secondary | ICD-10-CM | POA: Diagnosis not present

## 2023-03-18 DIAGNOSIS — M5451 Vertebrogenic low back pain: Secondary | ICD-10-CM | POA: Diagnosis not present

## 2023-03-22 ENCOUNTER — Other Ambulatory Visit (HOSPITAL_BASED_OUTPATIENT_CLINIC_OR_DEPARTMENT_OTHER): Payer: Self-pay

## 2023-03-22 MED ORDER — FLUAD 0.5 ML IM SUSY
0.5000 mL | PREFILLED_SYRINGE | Freq: Once | INTRAMUSCULAR | 0 refills | Status: AC
  Filled 2023-03-22: qty 0.5, 1d supply, fill #0

## 2023-03-22 MED ORDER — COMIRNATY 30 MCG/0.3ML IM SUSY
0.3000 mL | PREFILLED_SYRINGE | Freq: Once | INTRAMUSCULAR | 0 refills | Status: AC
  Filled 2023-03-22: qty 0.3, 1d supply, fill #0

## 2023-03-23 DIAGNOSIS — M5451 Vertebrogenic low back pain: Secondary | ICD-10-CM | POA: Diagnosis not present

## 2023-03-24 ENCOUNTER — Ambulatory Visit: Payer: Medicare HMO | Admitting: Podiatry

## 2023-03-24 ENCOUNTER — Encounter: Payer: Self-pay | Admitting: Podiatry

## 2023-03-24 ENCOUNTER — Ambulatory Visit (INDEPENDENT_AMBULATORY_CARE_PROVIDER_SITE_OTHER): Payer: Medicare HMO

## 2023-03-24 DIAGNOSIS — M778 Other enthesopathies, not elsewhere classified: Secondary | ICD-10-CM

## 2023-03-24 DIAGNOSIS — L03032 Cellulitis of left toe: Secondary | ICD-10-CM | POA: Diagnosis not present

## 2023-03-24 NOTE — Progress Notes (Signed)
Subjective:   Patient ID: Stephen Best, male   DOB: 67 y.o.   MRN: [id number]   HPI Patient states he is still having quite a bit of pain around his left big toe lateral side and is not taking antibiotics and states that it is just sore   ROS      Objective:  Physical Exam  Neurovascular status intact with some crusted irritated tissue left hallux lateral side with no proximal edema erythema drainage noted or no edema around the site of irritation just pain     Assessment:  Difficult to say whether or not this may be a deep paronychia infection or some other pathology or just irritated crusted tissue formation secondary to previous ingrown toenail removal     Plan:  H&P discussed patient agrees and I went ahead took x-rays to rule out other pathology.  Sterile prep anesthetized the left hallux 60 mg like Marcaine mixture using sterile instrumentation removed some tissue and nail from the lateral side open the entire area flushed it and applied sterile dressing and I want him to start soaks again and will be seen back if it still hurting in the next few weeks and may require total nail removal.  Reappoint to recheck  X-rays indicate that there is no signs of osteolysis or bone structure issue fixation in place from first MPJ fusion left

## 2023-03-25 DIAGNOSIS — M5451 Vertebrogenic low back pain: Secondary | ICD-10-CM | POA: Diagnosis not present

## 2023-03-28 ENCOUNTER — Other Ambulatory Visit: Payer: Self-pay | Admitting: Neurology

## 2023-03-28 DIAGNOSIS — G20A1 Parkinson's disease without dyskinesia, without mention of fluctuations: Secondary | ICD-10-CM

## 2023-03-28 DIAGNOSIS — G4733 Obstructive sleep apnea (adult) (pediatric): Secondary | ICD-10-CM | POA: Diagnosis not present

## 2023-03-28 DIAGNOSIS — R251 Tremor, unspecified: Secondary | ICD-10-CM

## 2023-03-28 DIAGNOSIS — G2581 Restless legs syndrome: Secondary | ICD-10-CM

## 2023-03-29 DIAGNOSIS — R5383 Other fatigue: Secondary | ICD-10-CM | POA: Diagnosis not present

## 2023-03-30 DIAGNOSIS — M5451 Vertebrogenic low back pain: Secondary | ICD-10-CM | POA: Diagnosis not present

## 2023-04-01 DIAGNOSIS — M5451 Vertebrogenic low back pain: Secondary | ICD-10-CM | POA: Diagnosis not present

## 2023-04-06 DIAGNOSIS — M5451 Vertebrogenic low back pain: Secondary | ICD-10-CM | POA: Diagnosis not present

## 2023-04-06 DIAGNOSIS — G4733 Obstructive sleep apnea (adult) (pediatric): Secondary | ICD-10-CM | POA: Diagnosis not present

## 2023-04-27 DIAGNOSIS — M5451 Vertebrogenic low back pain: Secondary | ICD-10-CM | POA: Diagnosis not present

## 2023-05-01 DIAGNOSIS — M5416 Radiculopathy, lumbar region: Secondary | ICD-10-CM | POA: Diagnosis not present

## 2023-05-17 DIAGNOSIS — M5416 Radiculopathy, lumbar region: Secondary | ICD-10-CM | POA: Diagnosis not present

## 2023-05-25 DIAGNOSIS — M5416 Radiculopathy, lumbar region: Secondary | ICD-10-CM | POA: Diagnosis not present

## 2023-06-11 DIAGNOSIS — K219 Gastro-esophageal reflux disease without esophagitis: Secondary | ICD-10-CM | POA: Diagnosis not present

## 2023-06-11 DIAGNOSIS — Z860100 Personal history of colon polyps, unspecified: Secondary | ICD-10-CM | POA: Diagnosis not present

## 2023-06-11 DIAGNOSIS — I10 Essential (primary) hypertension: Secondary | ICD-10-CM | POA: Diagnosis not present

## 2023-06-11 DIAGNOSIS — R2681 Unsteadiness on feet: Secondary | ICD-10-CM | POA: Diagnosis not present

## 2023-06-11 DIAGNOSIS — G20B1 Parkinson's disease with dyskinesia, without mention of fluctuations: Secondary | ICD-10-CM | POA: Diagnosis not present

## 2023-06-11 DIAGNOSIS — Z6837 Body mass index (BMI) 37.0-37.9, adult: Secondary | ICD-10-CM | POA: Diagnosis not present

## 2023-06-11 DIAGNOSIS — Z008 Encounter for other general examination: Secondary | ICD-10-CM | POA: Diagnosis not present

## 2023-06-11 DIAGNOSIS — G4733 Obstructive sleep apnea (adult) (pediatric): Secondary | ICD-10-CM | POA: Diagnosis not present

## 2023-06-17 DIAGNOSIS — M7042 Prepatellar bursitis, left knee: Secondary | ICD-10-CM | POA: Diagnosis not present

## 2023-06-28 DIAGNOSIS — M5416 Radiculopathy, lumbar region: Secondary | ICD-10-CM | POA: Diagnosis not present

## 2023-06-28 DIAGNOSIS — M4310 Spondylolisthesis, site unspecified: Secondary | ICD-10-CM | POA: Diagnosis not present

## 2023-06-28 DIAGNOSIS — M549 Dorsalgia, unspecified: Secondary | ICD-10-CM | POA: Diagnosis not present

## 2023-06-28 DIAGNOSIS — M79605 Pain in left leg: Secondary | ICD-10-CM | POA: Diagnosis not present

## 2023-07-02 DIAGNOSIS — M7042 Prepatellar bursitis, left knee: Secondary | ICD-10-CM | POA: Diagnosis not present

## 2023-07-03 ENCOUNTER — Other Ambulatory Visit: Payer: Self-pay | Admitting: Neurology

## 2023-07-03 DIAGNOSIS — R251 Tremor, unspecified: Secondary | ICD-10-CM

## 2023-07-03 DIAGNOSIS — G20A1 Parkinson's disease without dyskinesia, without mention of fluctuations: Secondary | ICD-10-CM

## 2023-07-03 DIAGNOSIS — G2581 Restless legs syndrome: Secondary | ICD-10-CM

## 2023-07-09 ENCOUNTER — Emergency Department (HOSPITAL_BASED_OUTPATIENT_CLINIC_OR_DEPARTMENT_OTHER)
Admission: EM | Admit: 2023-07-09 | Discharge: 2023-07-09 | Disposition: A | Discharge status: Home / Self Care | Payer: No Typology Code available for payment source | Attending: Emergency Medicine | Admitting: Emergency Medicine

## 2023-07-09 ENCOUNTER — Encounter (HOSPITAL_BASED_OUTPATIENT_CLINIC_OR_DEPARTMENT_OTHER): Payer: Self-pay | Admitting: Urology

## 2023-07-09 ENCOUNTER — Emergency Department (HOSPITAL_BASED_OUTPATIENT_CLINIC_OR_DEPARTMENT_OTHER): Payer: No Typology Code available for payment source

## 2023-07-09 ENCOUNTER — Other Ambulatory Visit: Payer: Self-pay

## 2023-07-09 DIAGNOSIS — R000 Tachycardia, unspecified: Secondary | ICD-10-CM | POA: Insufficient documentation

## 2023-07-09 DIAGNOSIS — E860 Dehydration: Secondary | ICD-10-CM | POA: Diagnosis not present

## 2023-07-09 DIAGNOSIS — I10 Essential (primary) hypertension: Secondary | ICD-10-CM | POA: Diagnosis not present

## 2023-07-09 DIAGNOSIS — I2510 Atherosclerotic heart disease of native coronary artery without angina pectoris: Secondary | ICD-10-CM | POA: Insufficient documentation

## 2023-07-09 DIAGNOSIS — Z9049 Acquired absence of other specified parts of digestive tract: Secondary | ICD-10-CM | POA: Diagnosis not present

## 2023-07-09 DIAGNOSIS — G20A1 Parkinson's disease without dyskinesia, without mention of fluctuations: Secondary | ICD-10-CM | POA: Insufficient documentation

## 2023-07-09 DIAGNOSIS — R0602 Shortness of breath: Secondary | ICD-10-CM | POA: Insufficient documentation

## 2023-07-09 DIAGNOSIS — E669 Obesity, unspecified: Secondary | ICD-10-CM | POA: Insufficient documentation

## 2023-07-09 DIAGNOSIS — K449 Diaphragmatic hernia without obstruction or gangrene: Secondary | ICD-10-CM | POA: Insufficient documentation

## 2023-07-09 DIAGNOSIS — Z20822 Contact with and (suspected) exposure to covid-19: Secondary | ICD-10-CM | POA: Insufficient documentation

## 2023-07-09 DIAGNOSIS — R0902 Hypoxemia: Secondary | ICD-10-CM | POA: Insufficient documentation

## 2023-07-09 DIAGNOSIS — G4730 Sleep apnea, unspecified: Secondary | ICD-10-CM | POA: Insufficient documentation

## 2023-07-09 DIAGNOSIS — R509 Fever, unspecified: Secondary | ICD-10-CM | POA: Diagnosis not present

## 2023-07-09 DIAGNOSIS — K219 Gastro-esophageal reflux disease without esophagitis: Secondary | ICD-10-CM | POA: Diagnosis not present

## 2023-07-09 DIAGNOSIS — Z6841 Body Mass Index (BMI) 40.0 and over, adult: Secondary | ICD-10-CM | POA: Insufficient documentation

## 2023-07-09 DIAGNOSIS — R112 Nausea with vomiting, unspecified: Secondary | ICD-10-CM | POA: Insufficient documentation

## 2023-07-09 DIAGNOSIS — Z79899 Other long term (current) drug therapy: Secondary | ICD-10-CM | POA: Insufficient documentation

## 2023-07-09 DIAGNOSIS — K429 Umbilical hernia without obstruction or gangrene: Secondary | ICD-10-CM | POA: Diagnosis not present

## 2023-07-09 DIAGNOSIS — K4090 Unilateral inguinal hernia, without obstruction or gangrene, not specified as recurrent: Secondary | ICD-10-CM | POA: Insufficient documentation

## 2023-07-09 DIAGNOSIS — R109 Unspecified abdominal pain: Secondary | ICD-10-CM | POA: Insufficient documentation

## 2023-07-09 DIAGNOSIS — R1110 Vomiting, unspecified: Secondary | ICD-10-CM | POA: Diagnosis not present

## 2023-07-09 LAB — URINALYSIS, ROUTINE W REFLEX MICROSCOPIC
Bilirubin Urine: NEGATIVE
Glucose, UA: NEGATIVE mg/dL
Hgb urine dipstick: NEGATIVE
Ketones, ur: 15 mg/dL — AB
Leukocytes,Ua: NEGATIVE
Nitrite: NEGATIVE
Protein, ur: NEGATIVE mg/dL
Specific Gravity, Urine: 1.015 (ref 1.005–1.030)
pH: 7 (ref 5.0–8.0)

## 2023-07-09 LAB — COMPREHENSIVE METABOLIC PANEL
ALT: 35 U/L (ref 0–44)
AST: 22 U/L (ref 15–41)
Albumin: 4.3 g/dL (ref 3.5–5.0)
Alkaline Phosphatase: 75 U/L (ref 38–126)
Anion gap: 12 (ref 5–15)
BUN: 19 mg/dL (ref 8–23)
CO2: 27 mmol/L (ref 22–32)
Calcium: 8.9 mg/dL (ref 8.9–10.3)
Chloride: 98 mmol/L (ref 98–111)
Creatinine, Ser: 0.98 mg/dL (ref 0.61–1.24)
GFR, Estimated: >60 mL/min (ref >60)
Glucose, Bld: 126 mg/dL — ABNORMAL HIGH (ref 70–99)
Potassium: 3.4 mmol/L — ABNORMAL LOW (ref 3.5–5.1)
Sodium: 137 mmol/L (ref 135–145)
Total Bilirubin: 1.2 mg/dL (ref 0.0–1.2)
Total Protein: 7.7 g/dL (ref 6.5–8.1)

## 2023-07-09 LAB — CBC
HCT: 50.9 % (ref 39.0–52.0)
Hemoglobin: 16.9 g/dL (ref 13.0–17.0)
MCH: 27.6 pg (ref 26.0–34.0)
MCHC: 33.2 g/dL (ref 30.0–36.0)
MCV: 83 fL (ref 80.0–100.0)
Platelets: 268 10*3/uL (ref 150–400)
RBC: 6.13 MIL/uL — ABNORMAL HIGH (ref 4.22–5.81)
RDW: 14 % (ref 11.5–15.5)
WBC: 12.8 10*3/uL — ABNORMAL HIGH (ref 4.0–10.5)
nRBC: 0 % (ref 0.0–0.2)

## 2023-07-09 LAB — LIPASE, BLOOD: Lipase: 61 U/L — ABNORMAL HIGH (ref 11–51)

## 2023-07-09 LAB — RESP PANEL BY RT-PCR (RSV, FLU A&B, COVID)  RVPGX2
Influenza A by PCR: NEGATIVE
Influenza B by PCR: NEGATIVE
Resp Syncytial Virus by PCR: NEGATIVE
SARS Coronavirus 2 by RT PCR: NEGATIVE

## 2023-07-09 LAB — D-DIMER, QUANTITATIVE: D-Dimer, Quant: 0.55 ug{FEU}/mL — ABNORMAL HIGH (ref 0.00–0.50)

## 2023-07-09 MED ORDER — PANTOPRAZOLE SODIUM 40 MG PO TBEC
40.0000 mg | DELAYED_RELEASE_TABLET | Freq: Once | ORAL | Status: AC
  Administered 2023-07-09: 40 mg via ORAL
  Filled 2023-07-09: qty 1

## 2023-07-09 MED ORDER — ONDANSETRON HCL 4 MG/2ML IJ SOLN
4.0000 mg | Freq: Once | INTRAMUSCULAR | Status: AC
  Administered 2023-07-09: 4 mg via INTRAVENOUS
  Filled 2023-07-09: qty 2

## 2023-07-09 MED ORDER — ONDANSETRON 4 MG PO TBDP: 4.0000 mg | ORAL_TABLET | Freq: Once | ORAL | Status: DC | PRN

## 2023-07-09 MED ORDER — ALUM & MAG HYDROXIDE-SIMETH 200-200-20 MG/5ML PO SUSP
30.0000 mL | Freq: Once | ORAL | Status: AC
  Administered 2023-07-09: 30 mL via ORAL
  Filled 2023-07-09: qty 30

## 2023-07-09 MED ORDER — IOHEXOL 350 MG/ML SOLN
100.0000 mL | Freq: Once | INTRAVENOUS | Status: AC | PRN
  Administered 2023-07-09: 100 mL via INTRAVENOUS

## 2023-07-09 MED ORDER — SODIUM CHLORIDE 0.9 % IV BOLUS
1000.0000 mL | Freq: Once | INTRAVENOUS | Status: AC
  Administered 2023-07-09: 1000 mL via INTRAVENOUS

## 2023-07-09 MED ORDER — ONDANSETRON 4 MG PO TBDP: 4.0000 mg | ORAL_TABLET | Freq: Three times a day (TID) | ORAL | 0 refills | Status: AC | PRN

## 2023-07-09 NOTE — ED Provider Notes (Signed)
[facility] Provider Note   CSN: [id number] Arrival date & time: 07/09/23  1859     History  Chief Complaint  Patient presents with   Emesis    Stephen Best is a 68 y.o. male.  Pt is a 68 yo male with pmhx significant for Parkinson's disease, htn, gerd, obesity, sleep apnea, and arthritis.  Pt developed emesis about 2 hrs pta.  He has some abd pain.  No sob.  No fever.       Home Medications Prior to Admission medications   Medication Sig Start Date End Date Taking? Authorizing Provider  ondansetron (ZOFRAN-ODT) 4 MG disintegrating tablet Take 1 tablet (4 mg total) by mouth every 8 (eight) hours as needed. 07/09/23  Yes Jacalyn Lefevre, MD  carbidopa-levodopa (SINEMET IR) 25-100 MG tablet TAKE 2 TABLETS BY MOUTH 3 (THREE) TIMES DAILY. 8AM/NOON/4PM 03/31/23   Tat, Octaviano Batty, DO  cephALEXin (KEFLEX) 500 MG capsule Take 1 capsule (500 mg total) by mouth 4 (four) times daily. 02/26/23   Lenn Sink, DPM  cholecalciferol (VITAMIN D) 1000 UNITS tablet Take 2,000 Units by mouth daily.    [provider]  clonazePAM (KLONOPIN) 0.5 MG tablet Take 0.5 tablets (0.25 mg total) by mouth at bedtime. 09/16/22   Tat, Octaviano Batty, DO  Omeprazole Magnesium (PRILOSEC OTC PO) Take 20 mg by mouth daily.    [provider]  pramipexole (MIRAPEX) 1.5 MG tablet TAKE 1 TABLET (1.5 MG TOTAL) BY MOUTH 3 (THREE) TIMES DAILY. 01/26/23   Tat, Octaviano Batty, DO  triamterene-hydrochlorothiazide (MAXZIDE-25) 37.5-25 MG tablet Take 1 tablet by mouth every morning. 12/13/19   [provider]      Allergies    Lisinopril and Other    Review of Systems   Review of Systems  Gastrointestinal:  Positive for abdominal pain, nausea and vomiting.  All other systems reviewed and are negative.   Physical Exam Updated Vital Signs BP (!) 146/81   Pulse 89   Temp 99.5 F (37.5 C)   Resp 16   Ht 5\' 5"  (1.651 m)   Wt 110.8 kg   SpO2 95%   BMI  40.65 kg/m  Physical Exam Vitals and nursing note reviewed.  Constitutional:      Appearance: Normal appearance. He is obese.  HENT:     Head: Normocephalic and atraumatic.     Right Ear: External ear normal.     Left Ear: External ear normal.     Nose: Nose normal.     Mouth/Throat:     Mouth: Mucous membranes are dry.  Eyes:     Extraocular Movements: Extraocular movements intact.     Conjunctiva/sclera: Conjunctivae normal.     Pupils: Pupils are equal, round, and reactive to light.  Cardiovascular:     Rate and Rhythm: Regular rhythm. Tachycardia present.     Pulses: Normal pulses.     Heart sounds: Normal heart sounds.  Pulmonary:     Effort: Pulmonary effort is normal.     Breath sounds: Normal breath sounds.  Abdominal:     General: Abdomen is flat. Bowel sounds are normal.     Palpations: Abdomen is soft.  Musculoskeletal:        General: Normal range of motion.     Cervical back: Normal range of motion and neck supple.  Skin:    General: Skin is warm.     Capillary Refill: Capillary refill takes less than 2 seconds.  Neurological:  General: No focal deficit present.     Mental Status: He is alert and oriented to person, place, and time.  Psychiatric:        Mood and Affect: Mood normal.        Behavior: Behavior normal.     ED Results / Procedures / Treatments   Labs (all labs ordered are listed, but only abnormal results are displayed) Labs Reviewed  LIPASE, BLOOD - Abnormal; Notable for the following components:      Result Value   Lipase 61 (*)    All other components within normal limits  COMPREHENSIVE METABOLIC PANEL - Abnormal; Notable for the following components:   Potassium 3.4 (*)    Glucose, Bld 126 (*)    All other components within normal limits  CBC - Abnormal; Notable for the following components:   WBC 12.8 (*)    RBC 6.13 (*)    All other components within normal limits  URINALYSIS, ROUTINE W REFLEX MICROSCOPIC - Abnormal; Notable  for the following components:   Ketones, ur 15 (*)    All other components within normal limits  D-DIMER, QUANTITATIVE - Abnormal; Notable for the following components:   D-Dimer, Quant 0.55 (*)    All other components within normal limits  RESP PANEL BY RT-PCR (RSV, FLU A&B, COVID)  [id number]    EKG EKG Interpretation Date/Time:  Friday July 09 2023 20:16:45 EST Ventricular Rate:  97 PR Interval:  202 QRS Duration:  112 QT Interval:  331 QTC Calculation: 421 R Axis:   -15  Text Interpretation: Sinus rhythm Probable left atrial enlargement Incomplete right bundle branch block Consider anterior infarct No significant change since last tracing Confirmed by Jacalyn Lefevre [phone]) on 07/09/2023 8:30:18 PM  Radiology CT Angio Chest PE W and/or Wo Contrast Result Date: 07/09/2023 CLINICAL DATA:  Emesis pain fever EXAM: CT ANGIOGRAPHY CHEST CT ABDOMEN AND PELVIS WITH CONTRAST TECHNIQUE: Multidetector CT imaging of the chest was performed using the standard protocol during bolus administration of intravenous contrast. Multiplanar CT image reconstructions and MIPs were obtained to evaluate the vascular anatomy. Multidetector CT imaging of the abdomen and pelvis was performed using the standard protocol during bolus administration of intravenous contrast. RADIATION DOSE REDUCTION: This exam was performed according to the departmental dose-optimization program which includes automated exposure control, adjustment of the mA and/or kV according to patient size and/or use of iterative reconstruction technique. CONTRAST:  OMNIPAQUE IOHEXOL 350 MG/ML SOLN COMPARISON:  Chest x-ray 07/09/2023 FINDINGS: CTA CHEST FINDINGS Cardiovascular: Satisfactory opacification of the pulmonary arteries to the segmental level. No evidence of pulmonary embolism. Nonaneurysmal aorta. Coronary vascular calcifications. Normal cardiac size. No pericardial effusion Mediastinum/Nodes: Patent trachea. No thyroid mass. No  suspicious lymph nodes. Small hiatal hernia Lungs/Pleura: Lungs are clear. No pleural effusion or pneumothorax. Musculoskeletal: No acute osseous abnormality Review of the MIP images confirms the above findings. CT ABDOMEN and PELVIS FINDINGS Hepatobiliary: No focal liver abnormality is seen. Status post cholecystectomy. No biliary dilatation. Pancreas: Unremarkable. No pancreatic ductal dilatation or surrounding inflammatory changes. Spleen: Normal in size without focal abnormality. Adrenals/Urinary Tract: Adrenal glands are unremarkable. Kidneys are normal, without renal calculi, focal lesion, or hydronephrosis. Bladder is unremarkable. Stomach/Bowel: Stomach is within normal limits. Appendix appears normal. No evidence of bowel wall thickening, distention, or inflammatory changes. Vascular/Lymphatic: Mild atherosclerosis. No aneurysm. No suspicious lymph nodes Reproductive: Prostate is unremarkable. Other: Negative for pelvic effusion or free air. Small fat containing inguinal hernias. Small umbilical hernia containing fat Musculoskeletal: Multilevel  degenerative changes. No acute osseous abnormality. Review of the MIP images confirms the above findings. IMPRESSION: 1. Negative for acute pulmonary embolus or aortic dissection. Clear lung fields. 2. No CT evidence for acute intra-abdominal or pelvic abnormality. Electronically Signed   By: Jasmine Pang M.D.   On: 07/09/2023 22:47   CT ABDOMEN PELVIS W CONTRAST Result Date: 07/09/2023 CLINICAL DATA:  Emesis pain fever EXAM: CT ANGIOGRAPHY CHEST CT ABDOMEN AND PELVIS WITH CONTRAST TECHNIQUE: Multidetector CT imaging of the chest was performed using the standard protocol during bolus administration of intravenous contrast. Multiplanar CT image reconstructions and MIPs were obtained to evaluate the vascular anatomy. Multidetector CT imaging of the abdomen and pelvis was performed using the standard protocol during bolus administration of intravenous contrast.  RADIATION DOSE REDUCTION: This exam was performed according to the departmental dose-optimization program which includes automated exposure control, adjustment of the mA and/or kV according to patient size and/or use of iterative reconstruction technique. CONTRAST:  OMNIPAQUE IOHEXOL 350 MG/ML SOLN COMPARISON:  Chest x-ray 07/09/2023 FINDINGS: CTA CHEST FINDINGS Cardiovascular: Satisfactory opacification of the pulmonary arteries to the segmental level. No evidence of pulmonary embolism. Nonaneurysmal aorta. Coronary vascular calcifications. Normal cardiac size. No pericardial effusion Mediastinum/Nodes: Patent trachea. No thyroid mass. No suspicious lymph nodes. Small hiatal hernia Lungs/Pleura: Lungs are clear. No pleural effusion or pneumothorax. Musculoskeletal: No acute osseous abnormality Review of the MIP images confirms the above findings. CT ABDOMEN and PELVIS FINDINGS Hepatobiliary: No focal liver abnormality is seen. Status post cholecystectomy. No biliary dilatation. Pancreas: Unremarkable. No pancreatic ductal dilatation or surrounding inflammatory changes. Spleen: Normal in size without focal abnormality. Adrenals/Urinary Tract: Adrenal glands are unremarkable. Kidneys are normal, without renal calculi, focal lesion, or hydronephrosis. Bladder is unremarkable. Stomach/Bowel: Stomach is within normal limits. Appendix appears normal. No evidence of bowel wall thickening, distention, or inflammatory changes. Vascular/Lymphatic: Mild atherosclerosis. No aneurysm. No suspicious lymph nodes Reproductive: Prostate is unremarkable. Other: Negative for pelvic effusion or free air. Small fat containing inguinal hernias. Small umbilical hernia containing fat Musculoskeletal: Multilevel degenerative changes. No acute osseous abnormality. Review of the MIP images confirms the above findings. IMPRESSION: 1. Negative for acute pulmonary embolus or aortic dissection. Clear lung fields. 2. No CT evidence for  acute intra-abdominal or pelvic abnormality. Electronically Signed   By: Jasmine Pang M.D.   On: 07/09/2023 22:47   DG Chest 2 View Result Date: 07/09/2023 CLINICAL DATA:  Hypoxia. Vomiting for several hours. Fever. Nonsmoker. EXAM: CHEST - 2 VIEW COMPARISON:  07/25/2016 FINDINGS: The heart size and mediastinal contours are within normal limits. Both lungs are clear. The visualized skeletal structures are unremarkable. IMPRESSION: No active cardiopulmonary disease. Electronically Signed   By: Burman Nieves M.D.   On: 07/09/2023 20:37    Procedures Procedures    Medications Ordered in ED Medications  sodium chloride 0.9 % bolus 1,000 mL (0 mLs Intravenous Stopped 07/09/23 2152)  ondansetron (ZOFRAN) injection 4 mg (4 mg Intravenous Given 07/09/23 2006)  iohexol (OMNIPAQUE) 350 MG/ML injection 100 mL (100 mLs Intravenous Contrast Given 07/09/23 2206)  alum & mag hydroxide-simeth (MAALOX/MYLANTA) 200-200-20 MG/5ML suspension 30 mL (30 mLs Oral Given 07/09/23 2329)  pantoprazole (PROTONIX) EC tablet 40 mg (40 mg Oral Given 07/09/23 2328)    ED Course/ Medical Decision Making/ A&P                                 Medical Decision Making Amount  and/or Complexity of Data Reviewed Labs: ordered. Radiology: ordered.  Risk OTC drugs. Prescription drug management.   This patient presents to the ED for concern of n/v, this involves an extensive number of treatment options, and is a complaint that carries with it a high risk of complications and morbidity.  The differential diagnosis includes flu/covid/rsv, pna, intraabdominal process, electrolyte abn, infection   Co morbidities that complicate the patient evaluation  Parkinson's disease, htn, gerd, obesity, sleep apnea, and arthritis   Additional history obtained:  Additional history obtained from epic chart review  Lab Tests:  I Ordered, and personally interpreted labs.  The pertinent results include:  cbc with wbc elevated at 12.8;  ddimer 0.55, ua + ketones, lip 61, cmp nl   Imaging Studies ordered:  I ordered imaging studies including cxr and ct chest/abd/pelvis I independently visualized and interpreted imaging which showed  CXR: No active cardiopulmonary disease.  CT chest/abd/pelvis: 1. Negative for acute pulmonary embolus or aortic dissection. Clear  lung fields.  2. No CT evidence for acute intra-abdominal or pelvic abnormality.    I agree with the radiologist interpretation   Cardiac Monitoring:  The patient was maintained on a cardiac monitor.  I personally viewed and interpreted the cardiac monitored which showed an underlying rhythm of: st   Medicines ordered and prescription drug management:  I ordered medication including ivfs/zofran  for sx  Reevaluation of the patient after these medicines showed that the patient improved I have reviewed the patients home medicines and have made adjustments as needed   Test Considered:  ct   Problem List / ED Course:  N/v:  improved after ivfs and meds.  He is tolerating fluids. Hypoxia:  etiology unclear.  He is now off oxygen and ct neg.  He is stable for d/c.  Return if worse.     Reevaluation:  After the interventions noted above, I reevaluated the patient and found that they have :improved   Social Determinants of Health:  Lives at home   Dispostion:  After consideration of the diagnostic results and the patients response to treatment, I feel that the patent would benefit from discharge with outpatient f/u.          Final Clinical Impression(s) / ED Diagnoses Final diagnoses:  Dehydration  Nausea and vomiting, unspecified vomiting type    Rx / DC Orders ED Discharge Orders          Ordered    ondansetron (ZOFRAN-ODT) 4 MG disintegrating tablet  Every 8 hours PRN        07/09/23 2338              Jacalyn Lefevre, MD 07/09/23 2347

## 2023-07-09 NOTE — ED Triage Notes (Signed)
Pt states emesis that started 2 hours ago  States dry heaving  Unknown Fever

## 2023-07-13 DIAGNOSIS — M5416 Radiculopathy, lumbar region: Secondary | ICD-10-CM | POA: Diagnosis not present

## 2023-07-29 DIAGNOSIS — M7042 Prepatellar bursitis, left knee: Secondary | ICD-10-CM | POA: Diagnosis not present

## 2023-07-29 DIAGNOSIS — G8929 Other chronic pain: Secondary | ICD-10-CM | POA: Diagnosis not present

## 2023-07-29 DIAGNOSIS — M5416 Radiculopathy, lumbar region: Secondary | ICD-10-CM | POA: Diagnosis not present

## 2023-07-29 DIAGNOSIS — G2581 Restless legs syndrome: Secondary | ICD-10-CM | POA: Diagnosis not present

## 2023-08-25 DIAGNOSIS — M7042 Prepatellar bursitis, left knee: Secondary | ICD-10-CM | POA: Diagnosis not present

## 2023-09-13 DIAGNOSIS — M545 Low back pain, unspecified: Secondary | ICD-10-CM | POA: Diagnosis not present

## 2023-09-13 DIAGNOSIS — F322 Major depressive disorder, single episode, severe without psychotic features: Secondary | ICD-10-CM | POA: Diagnosis not present

## 2023-10-06 DIAGNOSIS — G4700 Insomnia, unspecified: Secondary | ICD-10-CM | POA: Diagnosis not present

## 2023-10-06 DIAGNOSIS — F322 Major depressive disorder, single episode, severe without psychotic features: Secondary | ICD-10-CM | POA: Diagnosis not present

## 2023-10-06 DIAGNOSIS — F4323 Adjustment disorder with mixed anxiety and depressed mood: Secondary | ICD-10-CM | POA: Diagnosis not present

## 2023-10-12 ENCOUNTER — Other Ambulatory Visit: Payer: Self-pay | Admitting: Neurology

## 2023-10-12 DIAGNOSIS — R251 Tremor, unspecified: Secondary | ICD-10-CM

## 2023-10-12 DIAGNOSIS — G20A1 Parkinson's disease without dyskinesia, without mention of fluctuations: Secondary | ICD-10-CM

## 2023-10-12 DIAGNOSIS — G2581 Restless legs syndrome: Secondary | ICD-10-CM

## 2023-10-25 DIAGNOSIS — K219 Gastro-esophageal reflux disease without esophagitis: Secondary | ICD-10-CM | POA: Diagnosis not present

## 2023-10-25 DIAGNOSIS — K5900 Constipation, unspecified: Secondary | ICD-10-CM | POA: Diagnosis not present

## 2023-10-27 DIAGNOSIS — G8929 Other chronic pain: Secondary | ICD-10-CM | POA: Diagnosis not present

## 2023-10-27 DIAGNOSIS — F32A Depression, unspecified: Secondary | ICD-10-CM | POA: Diagnosis not present

## 2023-11-03 ENCOUNTER — Other Ambulatory Visit: Payer: Self-pay | Admitting: Neurology

## 2023-11-03 DIAGNOSIS — R251 Tremor, unspecified: Secondary | ICD-10-CM

## 2023-11-03 DIAGNOSIS — G2581 Restless legs syndrome: Secondary | ICD-10-CM

## 2023-11-03 DIAGNOSIS — G20A1 Parkinson's disease without dyskinesia, without mention of fluctuations: Secondary | ICD-10-CM

## 2023-11-06 DIAGNOSIS — F4323 Adjustment disorder with mixed anxiety and depressed mood: Secondary | ICD-10-CM | POA: Diagnosis not present

## 2023-11-12 DIAGNOSIS — G2581 Restless legs syndrome: Secondary | ICD-10-CM | POA: Diagnosis not present

## 2023-11-12 DIAGNOSIS — G4733 Obstructive sleep apnea (adult) (pediatric): Secondary | ICD-10-CM | POA: Diagnosis not present

## 2023-11-22 NOTE — Progress Notes (Unsigned)
 Assessment/Plan:    1.  Parkinsons Disease   -I questioned the dx for a long time (dx a [facility] years ago in 2015 but had no bradykinesia at time and presenting sx's were blepharospasm and RLS).  I was able to eventually see him off of the carbidopa /levodopa  and pramipexole  and he did look mildly parkinsonian with sx's on R; correlates with recent DaT scan  -His DaTscan  does demonstrate loss of dopamine on the left.  -We discussed that the new skin biopsy for alpha-synuclein hold on that for now (may also be good to hold until he hits MCR)  -take carbidopa /levodopa  25/100, 2 po tid and take at 8am/noon/4pm.  He is currently taking last at bed.  Discussed this again today as noting more tremor in day.    -discussed exercise - including biking, water therapy  -declines balance PT/aqua PT.  Can let me know if changes mind  2.  Restless leg  -This has been very refractory to treatment.  Augmentation with the agonist medications has been a real issue and I have tried to wean off the pramipexole  in the past.  [name] is working on this currently.  -He found Ultram  ineffective.  -He was previously on oxycodone  (not necessarily for this)  -He found higher dosages of gabapentin  with cognitive dulling.  He is currently on gabapentin , 300 mg after dinner and at bed  -he would like to continue klonopin , 0.5 mg, 1/2 po q hs.  Discussed r/b/se.  He is only taking prn  - Patient is now following with sleep medicine at [facility]  3.  Severe sleep apnea  - Declines treatment, per [name]  4.  Some dizziness  -check BP in various positions  -increase water intake   Subjective:   Stephen Best was seen today in follow-up for parkinsonism.  I have not seen the patient in over a year.  Last visit, I stopped his carbidopa /levodopa  25/250 and changed him to carbidopa /levodopa  25/100, 2 tablets 3 times per day and asked him to move the dosages closer together.  Until today, have not heard back from him.  He takes them  still at 8am/4pm/bed.  He has more tremor.   I do see that he has been to a sleep medicine specialist at [facility].  His most recent visit was through telemedicine on June 6.  As we had been telling the patient, he was on way too much pramipexole .  They noted at his first visit in September, 2024 that his current dose is 4 times of what is recommended for restless leg.  We had previously discussed augmentation with him and tried to get him off of it as well.  At the June 6 visit, they noted that he was taking pramipexole  0.5 mg at 7 AM, 3 PM and 9:30 PM.  He was also taking gabapentin  at 3 PM and 9:30 PM and tramadol  as needed.  They also noted severe OSA but I don't intend to use the sleep machine.  Balance not as good as previous.  Some dizziness   Current movement disorder medications: Pramipexole , 0.5 mg 3 times per day (following with [name] sleep medicine in [place]) Carbidopa /levodopa  100, 2 tablets 3 times per day Gabapentin , 300 mg, at 3 PM and 9:30 PM Clonazepam , 0.5 mg, half tablet at bedtime (started last visit) - only takes prn  PREVIOUS MEDICATIONS: requip  (for rls); pramipexole  (used to be on 3.75 mg q hs just for rls); ultram ; xanax ; carbidopa /levodopa  25/250   ALLERGIES:  Allergies  Allergen Reactions   Lisinopril     Other reaction(s): cough   Other Other (See Comments)    Hates versed      CURRENT MEDICATIONS:  Current Meds  Medication Sig   carbidopa -levodopa  (SINEMET  IR) 25-100 MG tablet TAKE 2 TABLETS BY MOUTH 3 (THREE) TIMES DAILY. 8AM/NOON/4PM   cholecalciferol (VITAMIN D) 1000 UNITS tablet Take 2,000 Units by mouth daily.   FLUoxetine (PROZAC) 20 MG capsule Take 20 mg by mouth daily.   gabapentin  (NEURONTIN ) 100 MG capsule Take 100 mg by mouth 2 (two) times daily. (Patient taking differently: Take 100 mg by mouth 2 (two) times daily. Taking at 8am and 12 pm)   gabapentin  (NEURONTIN ) 300 MG capsule Take 300 mg by mouth 2 (two) times daily. (Patient taking  differently: Take 300 mg by mouth 2 (two) times daily. Taking 1 tab at 3-4 pm and 9:30 pm)   magnesium 30 MG tablet Take 30 mg by mouth 2 (two) times daily.   Omeprazole Magnesium (PRILOSEC OTC PO) Take 20 mg by mouth daily.   ondansetron  (ZOFRAN -ODT) 4 MG disintegrating tablet Take 1 tablet (4 mg total) by mouth every 8 (eight) hours as needed.   pramipexole  (MIRAPEX ) 1.5 MG tablet TAKE 1 TABLET (1.5 MG TOTAL) BY MOUTH 3 (THREE) TIMES DAILY. (Patient taking differently: Take 1.5 mg by mouth 3 (three) times daily. 1/4 tab at 8am and noon, 1/2 at 3pm and 9:30pm)   traMADol  (ULTRAM ) 50 MG tablet Take 50 mg by mouth 3 (three) times daily as needed.   triamterene-hydrochlorothiazide  (MAXZIDE-25) 37.5-25 MG tablet Take 1 tablet by mouth every morning.     Objective:   VITALS:   Vitals:   11/24/23 0804  BP: 120/70  Pulse: 90  SpO2: 91%  Weight: 234 lb 6.4 oz (106.3 kg)    GEN:  The patient appears stated age and is in NAD. HEENT:  Normocephalic, atraumatic.  CV:  RRR Lungs:  CTAB  Neurological examination:  Orientation: The patient is alert and oriented x3.  Cranial nerves: There is good facial symmetry.  There is no significant facial hypomimia.  He has some periorbital edema around the right eye that he states is chronic.  Extraocular muscles are intact. The speech is fluent and clear. Hearing is intact to conversational tone. Motor: Strength is 5/5 in the bilateral upper extremities and at least antigravity in the lower extremities.   Movement examination: Tone: There is min increased tone in the Lue Abnormal movements: no tremor today Coordination:  There is mild decremation on the L Gait and Station: The patient has no difficulty arising out of a deep-seated chair without the use of the hands. The patient's stride length is good and normal today I have reviewed and interpreted the following labs independently   Chemistry      Component Value Date/Time   NA 137 07/09/2023 1926    NA 143 09/16/2016 1707   K 3.4 (L) 07/09/2023 1926   CL 98 07/09/2023 1926   CO2 27 07/09/2023 1926   BUN 19 07/09/2023 1926   BUN 10 09/16/2016 1707   CREATININE 0.98 07/09/2023 1926      Component Value Date/Time   CALCIUM 8.9 07/09/2023 1926   ALKPHOS 75 07/09/2023 1926   AST 22 07/09/2023 1926   ALT 35 07/09/2023 1926   BILITOT 1.2 07/09/2023 1926   BILITOT 0.4 09/16/2016 1707      Lab Results  Component Value Date   TSH 1.43 07/07/2021   Lab  Results  Component Value Date   WBC 12.8 (H) 07/09/2023   HGB 16.9 07/09/2023   HCT 50.9 07/09/2023   MCV 83.0 07/09/2023   PLT 268 07/09/2023    Total time spent on today's visit was 30 minutes, including both face-to-face time and nonface-to-face time.  Time included that spent on review of records (prior notes available to me/labs/imaging if pertinent), discussing treatment and goals, answering patient's questions and coordinating care.   Cc:  Merl Star, MD

## 2023-11-24 ENCOUNTER — Ambulatory Visit (INDEPENDENT_AMBULATORY_CARE_PROVIDER_SITE_OTHER): Admitting: Neurology

## 2023-11-24 VITALS — BP 120/70 | HR 90 | Wt 234.4 lb

## 2023-11-24 DIAGNOSIS — G20A1 Parkinson's disease without dyskinesia, without mention of fluctuations: Secondary | ICD-10-CM

## 2023-11-24 DIAGNOSIS — G4733 Obstructive sleep apnea (adult) (pediatric): Secondary | ICD-10-CM | POA: Diagnosis not present

## 2023-11-24 DIAGNOSIS — G2581 Restless legs syndrome: Secondary | ICD-10-CM | POA: Diagnosis not present

## 2023-11-24 NOTE — Patient Instructions (Signed)
 Take carbidopa /levodopa  25/100 to 2 tablets at 8am/noon/4pm Let us  know if you want to do aqua therapy or physical therapy for Parkinsons Disease  SAVE THE DATE!  We are planning a Parkinsons Disease educational symposium at [facility] in [place] on September 19.  More details to come!  We will have a movement disorder physician expert from [employer] coming to speak and a caregiver speaker.  We will have a panel of experts that will show you who you may need on your team of people on your journey with Parkinsons.  If you would like to be added to our email list to get further information, email [email or web address].  I hope to see you there!

## 2023-12-01 DIAGNOSIS — M1711 Unilateral primary osteoarthritis, right knee: Secondary | ICD-10-CM | POA: Diagnosis not present

## 2023-12-01 DIAGNOSIS — S76311A Strain of muscle, fascia and tendon of the posterior muscle group at thigh level, right thigh, initial encounter: Secondary | ICD-10-CM | POA: Diagnosis not present

## 2023-12-06 DIAGNOSIS — F4323 Adjustment disorder with mixed anxiety and depressed mood: Secondary | ICD-10-CM | POA: Diagnosis not present

## 2023-12-17 DIAGNOSIS — H02831 Dermatochalasis of right upper eyelid: Secondary | ICD-10-CM | POA: Diagnosis not present

## 2023-12-17 DIAGNOSIS — H524 Presbyopia: Secondary | ICD-10-CM | POA: Diagnosis not present

## 2023-12-17 DIAGNOSIS — H5213 Myopia, bilateral: Secondary | ICD-10-CM | POA: Diagnosis not present

## 2023-12-17 DIAGNOSIS — H11151 Pinguecula, right eye: Secondary | ICD-10-CM | POA: Diagnosis not present

## 2023-12-17 DIAGNOSIS — H2513 Age-related nuclear cataract, bilateral: Secondary | ICD-10-CM | POA: Diagnosis not present

## 2023-12-17 DIAGNOSIS — H0100B Unspecified blepharitis left eye, upper and lower eyelids: Secondary | ICD-10-CM | POA: Diagnosis not present

## 2023-12-17 DIAGNOSIS — H02834 Dermatochalasis of left upper eyelid: Secondary | ICD-10-CM | POA: Diagnosis not present

## 2023-12-17 DIAGNOSIS — H52203 Unspecified astigmatism, bilateral: Secondary | ICD-10-CM | POA: Diagnosis not present

## 2023-12-17 DIAGNOSIS — H532 Diplopia: Secondary | ICD-10-CM | POA: Diagnosis not present

## 2023-12-17 DIAGNOSIS — H0100A Unspecified blepharitis right eye, upper and lower eyelids: Secondary | ICD-10-CM | POA: Diagnosis not present

## 2023-12-17 DIAGNOSIS — H43813 Vitreous degeneration, bilateral: Secondary | ICD-10-CM | POA: Diagnosis not present

## 2024-01-14 DIAGNOSIS — R195 Other fecal abnormalities: Secondary | ICD-10-CM | POA: Diagnosis not present

## 2024-01-14 DIAGNOSIS — R159 Full incontinence of feces: Secondary | ICD-10-CM | POA: Diagnosis not present

## 2024-01-14 DIAGNOSIS — K219 Gastro-esophageal reflux disease without esophagitis: Secondary | ICD-10-CM | POA: Diagnosis not present

## 2024-01-19 ENCOUNTER — Other Ambulatory Visit: Payer: Self-pay

## 2024-01-19 ENCOUNTER — Ambulatory Visit
Admission: RE | Admit: 2024-01-19 | Discharge: 2024-01-19 | Disposition: A | Discharge status: Home / Self Care | Source: Ambulatory Visit

## 2024-01-19 DIAGNOSIS — R195 Other fecal abnormalities: Secondary | ICD-10-CM

## 2024-01-19 DIAGNOSIS — R197 Diarrhea, unspecified: Secondary | ICD-10-CM | POA: Diagnosis not present

## 2024-01-19 DIAGNOSIS — R159 Full incontinence of feces: Secondary | ICD-10-CM | POA: Diagnosis not present

## 2024-01-19 DIAGNOSIS — R1013 Epigastric pain: Secondary | ICD-10-CM | POA: Diagnosis not present

## 2024-01-27 ENCOUNTER — Other Ambulatory Visit: Payer: Self-pay | Admitting: Neurology

## 2024-01-27 DIAGNOSIS — R251 Tremor, unspecified: Secondary | ICD-10-CM

## 2024-01-27 DIAGNOSIS — G2581 Restless legs syndrome: Secondary | ICD-10-CM

## 2024-01-27 DIAGNOSIS — G20A1 Parkinson's disease without dyskinesia, without mention of fluctuations: Secondary | ICD-10-CM

## 2024-02-23 DIAGNOSIS — G2581 Restless legs syndrome: Secondary | ICD-10-CM | POA: Diagnosis not present

## 2024-02-23 DIAGNOSIS — G4733 Obstructive sleep apnea (adult) (pediatric): Secondary | ICD-10-CM | POA: Diagnosis not present

## 2024-02-24 DIAGNOSIS — M5416 Radiculopathy, lumbar region: Secondary | ICD-10-CM | POA: Diagnosis not present

## 2024-03-13 DIAGNOSIS — F322 Major depressive disorder, single episode, severe without psychotic features: Secondary | ICD-10-CM | POA: Diagnosis not present

## 2024-03-13 DIAGNOSIS — G2581 Restless legs syndrome: Secondary | ICD-10-CM | POA: Diagnosis not present

## 2024-03-13 DIAGNOSIS — I5189 Other ill-defined heart diseases: Secondary | ICD-10-CM | POA: Diagnosis not present

## 2024-03-13 DIAGNOSIS — I10 Essential (primary) hypertension: Secondary | ICD-10-CM | POA: Diagnosis not present

## 2024-03-13 DIAGNOSIS — Z23 Encounter for immunization: Secondary | ICD-10-CM | POA: Diagnosis not present

## 2024-03-13 DIAGNOSIS — Z0000 Encounter for general adult medical examination without abnormal findings: Secondary | ICD-10-CM | POA: Diagnosis not present

## 2024-03-13 DIAGNOSIS — G4733 Obstructive sleep apnea (adult) (pediatric): Secondary | ICD-10-CM | POA: Diagnosis not present

## 2024-03-13 DIAGNOSIS — G20C Parkinsonism, unspecified: Secondary | ICD-10-CM | POA: Diagnosis not present

## 2024-03-13 DIAGNOSIS — Z125 Encounter for screening for malignant neoplasm of prostate: Secondary | ICD-10-CM | POA: Diagnosis not present

## 2024-03-13 DIAGNOSIS — Z5181 Encounter for therapeutic drug level monitoring: Secondary | ICD-10-CM | POA: Diagnosis not present

## 2024-03-13 DIAGNOSIS — M5451 Vertebrogenic low back pain: Secondary | ICD-10-CM | POA: Diagnosis not present

## 2024-03-16 DIAGNOSIS — G20A1 Parkinson's disease without dyskinesia, without mention of fluctuations: Secondary | ICD-10-CM | POA: Diagnosis not present

## 2024-03-16 DIAGNOSIS — M5416 Radiculopathy, lumbar region: Secondary | ICD-10-CM | POA: Diagnosis not present

## 2024-03-16 DIAGNOSIS — M4150 Other secondary scoliosis, site unspecified: Secondary | ICD-10-CM | POA: Diagnosis not present

## 2024-03-16 DIAGNOSIS — M4306 Spondylolysis, lumbar region: Secondary | ICD-10-CM | POA: Diagnosis not present

## 2024-05-09 ENCOUNTER — Other Ambulatory Visit: Payer: Self-pay | Admitting: Neurology

## 2024-05-09 DIAGNOSIS — G2581 Restless legs syndrome: Secondary | ICD-10-CM

## 2024-05-09 DIAGNOSIS — R251 Tremor, unspecified: Secondary | ICD-10-CM

## 2024-05-09 DIAGNOSIS — G20A1 Parkinson's disease without dyskinesia, without mention of fluctuations: Secondary | ICD-10-CM

## 2024-06-14 NOTE — Progress Notes (Signed)
 "   Assessment/Plan:    1.  Parkinsons Disease   -I questioned the dx for a long time (dx a JH years ago in 2015 but had no bradykinesia at time and presenting sx's were blepharospasm and RLS).  I was able to eventually see him off of the carbidopa /levodopa  and pramipexole  and he did look mildly parkinsonian with sx's on R; correlates with recent DaT scan  -His DaTscan  does demonstrate loss of dopamine on the left.  -We discussed that the new skin biopsy for alpha-synuclein hold on that for now (may also be good to hold until he hits MCR)  -take carbidopa /levodopa  25/100, 2 po tid and take at 8am/noon/4pm.  He is currently taking last at bed.  We have discussed the last many visits that he is spreading this medication out far to much and I want him to move the medication closer together.  He was rigid today.  He had not taken his medication since 7:30 AM and he was seen at approximately 2:30 PM.  -discussed exercise - including biking, water therapy  -declines balance PT/aqua PT.  Can let me know if changes mind  2.  Restless leg  -This has been very refractory to treatment.  Augmentation with the agonist medications has been a real issue and I have tried to wean off the pramipexole  in the past.  [name] has been trying to work on this, but it looks like it has not been successful.  He states today that he had canceled his appointments with [name] because he did not think that their treatments were helpful and wanted me to take back over the writing of his pramipexole .  I discussed with the patient that I did not feel comfortable with this.  His dosages of pramipexole  are too high and his restless leg has been very refractory to treatment.  He stated that he has been trying to work with [name] but did not feel that the reduction of pramipexole  was helpful, and I again explained the phenomenon of augmentation to him.  He was agreeable to following back up with the sleep physician at [name].  -He found  Ultram  ineffective.  -He was given low-dose oxycodone  by the sleep physician at [name] and he did not find it particularly effective.  -He found higher dosages of gabapentin  with cognitive dulling.  He is currently on gabapentin , 300 mg after dinner and at bed  -he would like to continue klonopin , 0.5 mg, 1/2 po q hs.  Discussed r/b/se.  He is only taking prn  - Patient is now following with sleep medicine at [name]  3.  Severe sleep apnea  - Declines treatment, per [name]  4.  Low back pain with lumbar radiculopathy  -having [place] stim placed tomorrow.   Subjective:   Stephen Best was seen today in follow-up for parkinsonism.  Last visit, I encouraged the patient to take his carbidopa /levodopa  25/100 at 8 AM/noon/4 PM, as he had been taking the last 1 at bedtime.   He reports that he last took his dose today at 7:30am and will take next at 3:30pm and then the last at bed.  He still follows with sleep medicine out of [name].  They have him on gabapentin , high-dose pramipexole  (for restless leg) and trialed him on low-dose oxycodone , but he is no longer taking that.  Notes from September from [name] do indicate that they have advised the patient that they wanted to the pramipexole  dose by 0.5 mg.  He states that  he tried but rls was bad and he doesn't think that he has augmentation.Separately, he notes he is having surgery for [facility] [place] [facility].     Current movement disorder medications: Pramipexole , 0.5 mg 3 times per day (following with [name] sleep medicine in [name]) Carbidopa /levodopa  100, 2 tablets 3 times per day Gabapentin , 300 mg, at 3 PM and 9:30 PM Clonazepam , 0.5 mg, half tablet at bedtime (reports he is off of this)  PREVIOUS MEDICATIONS: requip  (for rls); pramipexole  (used to be on 3.75 mg q hs just for rls); ultram ; xanax ; carbidopa /levodopa  25/250   ALLERGIES:   Allergies  Allergen Reactions   Lisinopril     Other reaction(s): cough   Other Other (See Comments)     Hates versed      CURRENT MEDICATIONS:  Current Meds  Medication Sig   carbidopa -levodopa  (SINEMET  IR) 25-100 MG tablet TAKE 2 TABLETS BY MOUTH 3 (THREE) TIMES DAILY. 8AM/NOON/4PM   cholecalciferol (VITAMIN D) 1000 UNITS tablet Take 2,000 Units by mouth daily.   FLUoxetine (PROZAC) 20 MG capsule Take 20 mg by mouth daily.   gabapentin  (NEURONTIN ) 100 MG capsule Take 100 mg by mouth 2 (two) times daily. (Patient taking differently: Take 100 mg by mouth 2 (two) times daily. Taking at 8 amand 12pm)   gabapentin  (NEURONTIN ) 300 MG capsule Take 300 mg by mouth 2 (two) times daily. (Patient taking differently: Take 300 mg by mouth 2 (two) times daily. Taking 1 tab at 3-4 pm and 9:30 pm)   magnesium 30 MG tablet Take 30 mg by mouth 2 (two) times daily.   Omeprazole Magnesium (PRILOSEC OTC PO) Take 20 mg by mouth daily.   ondansetron  (ZOFRAN -ODT) 4 MG disintegrating tablet Take 1 tablet (4 mg total) by mouth every 8 (eight) hours as needed.   pramipexole  (MIRAPEX ) 1.5 MG tablet TAKE 1 TABLET (1.5 MG TOTAL) BY MOUTH 3 (THREE) TIMES DAILY. (Patient taking differently: Take 1.5 mg by mouth 3 (three) times daily. 1/4 tab at 8am and noon, 1/2 at 3pm and 9:30pm)   traMADol  (ULTRAM ) 50 MG tablet Take 50 mg by mouth 3 (three) times daily as needed.   triamterene-hydrochlorothiazide  (MAXZIDE-25) 37.5-25 MG tablet Take 1 tablet by mouth every morning.     Objective:   VITALS:   Vitals:   06/15/24 1405  BP: 124/76  Pulse: 74  SpO2: 99%  Weight: 234 lb (106.1 kg)     GEN:  The patient appears stated age and is in NAD. HEENT:  Normocephalic, atraumatic.  CV:  RRR Lungs:  CTAB  Neurological examination:  Orientation: The patient is alert and oriented x3.  Cranial nerves: There is good facial symmetry.  There is no significant facial hypomimia.  He has some periorbital edema around the right eye that he states is chronic.  Extraocular muscles are intact. The speech is fluent and clear. Hearing is  intact to conversational tone. Motor: Strength is 5/5 in the bilateral upper extremities and at least antigravity in the lower extremities.   Movement examination: Tone: There is min increased tone in the Lue Abnormal movements: no tremor today Coordination:  There is mild decremation on the L Gait and Station: The patient has no difficulty arising out of a deep-seated chair without the use of the hands. The patient's stride length is good and normal today I have reviewed and interpreted the following labs independently   Chemistry      Component Value Date/Time   NA 137 07/09/2023 1926  NA 143 09/16/2016 1707   K 3.4 (L) 07/09/2023 1926   CL 98 07/09/2023 1926   CO2 27 07/09/2023 1926   BUN 19 07/09/2023 1926   BUN 10 09/16/2016 1707   CREATININE 0.98 07/09/2023 1926      Component Value Date/Time   CALCIUM 8.9 07/09/2023 1926   ALKPHOS 75 07/09/2023 1926   AST 22 07/09/2023 1926   ALT 35 07/09/2023 1926   BILITOT 1.2 07/09/2023 1926   BILITOT 0.4 09/16/2016 1707      Lab Results  Component Value Date   TSH 1.43 07/07/2021   Lab Results  Component Value Date   WBC 12.8 (H) 07/09/2023   HGB 16.9 07/09/2023   HCT 50.9 07/09/2023   MCV 83.0 07/09/2023   PLT 268 07/09/2023      Cc:  Rexanne Ingle, MD "

## 2024-06-15 ENCOUNTER — Ambulatory Visit (INDEPENDENT_AMBULATORY_CARE_PROVIDER_SITE_OTHER): Admitting: Neurology

## 2024-06-15 ENCOUNTER — Encounter: Payer: Self-pay | Admitting: Neurology

## 2024-06-15 VITALS — BP 124/76 | HR 74 | Wt 234.0 lb

## 2024-06-15 DIAGNOSIS — M545 Low back pain, unspecified: Secondary | ICD-10-CM

## 2024-06-15 DIAGNOSIS — G20A1 Parkinson's disease without dyskinesia, without mention of fluctuations: Secondary | ICD-10-CM

## 2024-06-15 DIAGNOSIS — G2581 Restless legs syndrome: Secondary | ICD-10-CM

## 2024-06-15 NOTE — Patient Instructions (Signed)
 Move your carbidopa /levodopa  25/100, 2 at 7am, 2 at 11am, 2 at 4pm  The physicians and staff at [facility] are committed to providing excellent care. You may receive a survey requesting feedback about your experience at our office. We strive to receive very good responses to the survey questions. If you feel that your experience would prevent you from giving the office a very good  response, please contact our office to try to remedy the situation. We may be reached at [phone]. Thank you for taking the time out of your busy day to complete the survey.

## 2024-12-14 ENCOUNTER — Ambulatory Visit: Payer: Self-pay | Admitting: Neurology
